# Patient Record
Sex: Male | Born: 1965 | Race: White | Hispanic: No | Marital: Married | Smoking: Never smoker
Health system: Southern US, Community
[De-identification: ages and names within clinical notes are randomized; demographics above are authoritative.]

## PROBLEM LIST (undated history)

## (undated) DIAGNOSIS — F419 Anxiety disorder, unspecified: Secondary | ICD-10-CM

## (undated) DIAGNOSIS — T4145XA Adverse effect of unspecified anesthetic, initial encounter: Secondary | ICD-10-CM

## (undated) DIAGNOSIS — IMO0002 Reserved for concepts with insufficient information to code with codable children: Secondary | ICD-10-CM

## (undated) DIAGNOSIS — T8859XA Other complications of anesthesia, initial encounter: Secondary | ICD-10-CM

## (undated) DIAGNOSIS — Z800 Family history of malignant neoplasm of digestive organs: Secondary | ICD-10-CM

## (undated) DIAGNOSIS — C189 Malignant neoplasm of colon, unspecified: Secondary | ICD-10-CM

## (undated) DIAGNOSIS — D649 Anemia, unspecified: Secondary | ICD-10-CM

## (undated) HISTORY — DX: Reserved for concepts with insufficient information to code with codable children: IMO0002

## (undated) HISTORY — DX: Family history of malignant neoplasm of digestive organs: Z80.0

## (undated) HISTORY — DX: Anxiety disorder, unspecified: F41.9

## (undated) HISTORY — PX: FRACTURE SURGERY: SHX138

## (undated) HISTORY — PX: TONSILLECTOMY: SUR1361

---

## 2008-01-22 ENCOUNTER — Encounter: Admission: RE | Admit: 2008-01-22 | Discharge: 2008-01-22 | Payer: Self-pay | Admitting: Neurosurgery

## 2010-02-18 ENCOUNTER — Encounter: Admission: RE | Admit: 2010-02-18 | Discharge: 2010-02-18 | Payer: Self-pay | Admitting: Neurosurgery

## 2010-09-23 ENCOUNTER — Other Ambulatory Visit: Payer: Self-pay | Admitting: Neurosurgery

## 2010-09-23 DIAGNOSIS — M47816 Spondylosis without myelopathy or radiculopathy, lumbar region: Secondary | ICD-10-CM

## 2010-09-25 ENCOUNTER — Ambulatory Visit
Admission: RE | Admit: 2010-09-25 | Discharge: 2010-09-25 | Disposition: A | Discharge status: Home / Self Care | Payer: BC Managed Care – PPO | Source: Ambulatory Visit | Attending: Neurosurgery | Admitting: Neurosurgery

## 2010-09-25 ENCOUNTER — Other Ambulatory Visit: Payer: Self-pay | Admitting: Neurosurgery

## 2010-09-25 DIAGNOSIS — M47816 Spondylosis without myelopathy or radiculopathy, lumbar region: Secondary | ICD-10-CM

## 2010-11-25 ENCOUNTER — Other Ambulatory Visit: Payer: Self-pay | Admitting: Neurosurgery

## 2010-11-25 DIAGNOSIS — M47816 Spondylosis without myelopathy or radiculopathy, lumbar region: Secondary | ICD-10-CM

## 2010-11-28 ENCOUNTER — Ambulatory Visit
Admission: RE | Admit: 2010-11-28 | Discharge: 2010-11-28 | Disposition: A | Discharge status: Home / Self Care | Payer: BC Managed Care – PPO | Source: Ambulatory Visit | Attending: Neurosurgery | Admitting: Neurosurgery

## 2010-11-28 DIAGNOSIS — M47816 Spondylosis without myelopathy or radiculopathy, lumbar region: Secondary | ICD-10-CM

## 2010-11-28 MED ORDER — METHYLPREDNISOLONE ACETATE 40 MG/ML INJ SUSP (RADIOLOG
120.0000 mg | Freq: Once | INTRAMUSCULAR | Status: AC
  Administered 2010-11-28: 120 mg via EPIDURAL

## 2010-11-28 MED ORDER — IOHEXOL 180 MG/ML  SOLN
1.0000 mL | Freq: Once | INTRAMUSCULAR | Status: AC | PRN
  Administered 2010-11-28: 1 mL via EPIDURAL

## 2011-07-27 ENCOUNTER — Other Ambulatory Visit: Payer: Self-pay | Admitting: Neurosurgery

## 2011-07-27 DIAGNOSIS — M549 Dorsalgia, unspecified: Secondary | ICD-10-CM

## 2011-07-30 ENCOUNTER — Ambulatory Visit
Admission: RE | Admit: 2011-07-30 | Discharge: 2011-07-30 | Disposition: A | Discharge status: Home / Self Care | Payer: BC Managed Care – PPO | Source: Ambulatory Visit | Attending: Neurosurgery | Admitting: Neurosurgery

## 2011-07-30 DIAGNOSIS — M549 Dorsalgia, unspecified: Secondary | ICD-10-CM

## 2011-07-30 MED ORDER — IOHEXOL 180 MG/ML  SOLN
1.0000 mL | Freq: Once | INTRAMUSCULAR | Status: AC | PRN
  Administered 2011-07-30: 1 mL via EPIDURAL

## 2011-07-30 MED ORDER — METHYLPREDNISOLONE ACETATE 40 MG/ML INJ SUSP (RADIOLOG
120.0000 mg | Freq: Once | INTRAMUSCULAR | Status: AC
  Administered 2011-07-30: 120 mg via EPIDURAL

## 2011-07-30 NOTE — Discharge Instructions (Signed)

## 2011-10-12 ENCOUNTER — Other Ambulatory Visit: Payer: Self-pay | Admitting: Neurosurgery

## 2011-10-12 DIAGNOSIS — M47816 Spondylosis without myelopathy or radiculopathy, lumbar region: Secondary | ICD-10-CM

## 2011-10-20 ENCOUNTER — Other Ambulatory Visit: Payer: BC Managed Care – PPO

## 2011-10-20 ENCOUNTER — Ambulatory Visit
Admission: RE | Admit: 2011-10-20 | Discharge: 2011-10-20 | Disposition: A | Discharge status: Home / Self Care | Payer: BC Managed Care – PPO | Source: Ambulatory Visit | Attending: Neurosurgery | Admitting: Neurosurgery

## 2011-10-20 DIAGNOSIS — M47816 Spondylosis without myelopathy or radiculopathy, lumbar region: Secondary | ICD-10-CM

## 2011-10-20 MED ORDER — METHYLPREDNISOLONE ACETATE 40 MG/ML INJ SUSP (RADIOLOG
120.0000 mg | Freq: Once | INTRAMUSCULAR | Status: AC
  Administered 2011-10-20: 120 mg via EPIDURAL

## 2011-10-20 MED ORDER — IOHEXOL 180 MG/ML  SOLN
1.0000 mL | Freq: Once | INTRAMUSCULAR | Status: AC | PRN
  Administered 2011-10-20: 1 mL via EPIDURAL

## 2011-11-09 HISTORY — PX: SPINE SURGERY: SHX786

## 2012-07-27 ENCOUNTER — Other Ambulatory Visit: Payer: Self-pay | Admitting: *Deleted

## 2012-07-27 MED ORDER — ALPRAZOLAM 0.5 MG PO TABS: 0.5000 mg | ORAL_TABLET | Freq: Three times a day (TID) | ORAL | Status: DC | PRN

## 2012-09-20 ENCOUNTER — Telehealth: Payer: Self-pay | Admitting: *Deleted

## 2012-09-22 ENCOUNTER — Other Ambulatory Visit: Payer: Self-pay

## 2012-09-22 NOTE — Telephone Encounter (Signed)
May refill this for 6 mo

## 2012-09-22 NOTE — Telephone Encounter (Signed)
In Epic as filled 4/1 5 refills and RX printed  That was when a bunch of RX's were printed and disappeared  Joyce Gross called the RX in with no refills  Patient bottle reflects that   Call RX in and let patient know

## 2012-09-23 ENCOUNTER — Telehealth: Payer: Self-pay | Admitting: *Deleted

## 2012-09-23 MED ORDER — ALPRAZOLAM 0.5 MG PO TABS: 0.5000 mg | ORAL_TABLET | Freq: Three times a day (TID) | ORAL | Status: DC | PRN

## 2012-09-23 NOTE — Telephone Encounter (Signed)
See refill request from 09/22/12 where [facility] approved it

## 2012-09-27 NOTE — Telephone Encounter (Signed)
Called and corrected RX for Xanax

## 2012-10-27 ENCOUNTER — Telehealth: Payer: Self-pay

## 2012-10-27 MED ORDER — TESTOSTERONE CYPIONATE 200 MG/ML IM SOLN: 200.0000 mg | INTRAMUSCULAR | Status: DC

## 2012-10-27 MED ORDER — ALPRAZOLAM 0.5 MG PO TABS: 0.5000 mg | ORAL_TABLET | Freq: Three times a day (TID) | ORAL | Status: DC | PRN

## 2012-10-27 NOTE — Telephone Encounter (Signed)
Needs xanax and testosterone refilled

## 2013-02-16 ENCOUNTER — Other Ambulatory Visit: Payer: Self-pay

## 2013-02-16 MED ORDER — TESTOSTERONE CYPIONATE 200 MG/ML IM SOLN: 200.0000 mg | INTRAMUSCULAR | Status: DC

## 2013-02-16 NOTE — Telephone Encounter (Signed)
Okay x1 Because of this is testosterone replacement patient needs visit with provider for testosterone level, PSA, and rectal exam. He also needs a CBC

## 2013-02-16 NOTE — Telephone Encounter (Signed)
Not seen since EPIC  [facility] 

## 2013-03-13 ENCOUNTER — Other Ambulatory Visit: Payer: Self-pay | Admitting: *Deleted

## 2013-03-13 NOTE — Telephone Encounter (Signed)
Rxs last filled on 10-6. Please advise. If approved please print and route to Pool A so nurse can call patient to pick up rxs

## 2013-03-13 NOTE — Telephone Encounter (Signed)
Unable to refill prescriptions because patient has not been seen in the past six-month

## 2013-03-16 ENCOUNTER — Ambulatory Visit (INDEPENDENT_AMBULATORY_CARE_PROVIDER_SITE_OTHER): Payer: BC Managed Care – PPO | Admitting: Family Medicine

## 2013-03-16 ENCOUNTER — Encounter: Payer: Self-pay | Admitting: Family Medicine

## 2013-03-16 VITALS — BP 138/90 | HR 67 | Temp 99.1°F | Ht 70.5 in | Wt 223.0 lb

## 2013-03-16 DIAGNOSIS — I10 Essential (primary) hypertension: Secondary | ICD-10-CM

## 2013-03-16 DIAGNOSIS — E785 Hyperlipidemia, unspecified: Secondary | ICD-10-CM

## 2013-03-16 DIAGNOSIS — Z800 Family history of malignant neoplasm of digestive organs: Secondary | ICD-10-CM | POA: Insufficient documentation

## 2013-03-16 DIAGNOSIS — E291 Testicular hypofunction: Secondary | ICD-10-CM

## 2013-03-16 DIAGNOSIS — E349 Endocrine disorder, unspecified: Secondary | ICD-10-CM | POA: Insufficient documentation

## 2013-03-16 DIAGNOSIS — F419 Anxiety disorder, unspecified: Secondary | ICD-10-CM | POA: Insufficient documentation

## 2013-03-16 DIAGNOSIS — E559 Vitamin D deficiency, unspecified: Secondary | ICD-10-CM

## 2013-03-16 DIAGNOSIS — F411 Generalized anxiety disorder: Secondary | ICD-10-CM

## 2013-03-16 DIAGNOSIS — Z8739 Personal history of other diseases of the musculoskeletal system and connective tissue: Secondary | ICD-10-CM

## 2013-03-16 DIAGNOSIS — N400 Enlarged prostate without lower urinary tract symptoms: Secondary | ICD-10-CM

## 2013-03-16 LAB — POCT UA - MICROSCOPIC ONLY
Crystals, Ur, HPF, POC: NEGATIVE
Yeast, UA: NEGATIVE

## 2013-03-16 LAB — POCT URINALYSIS DIPSTICK
Leukocytes, UA: NEGATIVE
Nitrite, UA: NEGATIVE
Protein, UA: NEGATIVE
Urobilinogen, UA: NEGATIVE
pH, UA: 6.5

## 2013-03-16 MED ORDER — ALPRAZOLAM 0.5 MG PO TABS: 0.5000 mg | ORAL_TABLET | Freq: Three times a day (TID) | ORAL | Status: DC | PRN

## 2013-03-16 NOTE — Progress Notes (Signed)
Subjective:    Patient ID: Luke Miller, male    DOB: 18-Sep-1965, 47 y.o.   MRN: [id number]  HPI Pt here for follow up and management of chronic medical problems. Patient is having no particular complaints except for his chronic back pain and his third shift work habits. He indicates that his energy level is good and that he continues to have problems going to sleep after working third shift. The need for these regular exams was explained to the patient today to monitor the prostate because of the testosterone injections and because of taking Xanax.      There are no active problems to display for this patient.  Outpatient Encounter Prescriptions as of 03/16/2013  Medication Sig  . ALPRAZolam (XANAX) 0.5 MG tablet Take 1 tablet (0.5 mg total) by mouth 3 (three) times daily as needed.  Marland Kitchen oxyCODONE (ROXICODONE) 15 MG immediate release tablet Take 1 tablet by mouth every 4 (four) hours as needed.  . testosterone cypionate (DEPO-TESTOSTERONE) 200 MG/ML injection Inject 1 mL (200 mg total) into the muscle every 14 (fourteen) days.    Review of Systems  Constitutional: Negative.   HENT: Negative.   Eyes: Negative.   Respiratory: Negative.   Cardiovascular: Negative.   Gastrointestinal: Negative.   Endocrine: Negative.   Genitourinary: Negative.   Musculoskeletal: Positive for back pain.  Skin: Negative.   Allergic/Immunologic: Negative.   Neurological: Negative.   Hematological: Negative.   Psychiatric/Behavioral: Negative.        Objective:   Physical Exam  Nursing note and vitals reviewed. Constitutional: He is oriented to person, place, and time. He appears well-developed and well-nourished. No distress.  HENT:  Head: Normocephalic and atraumatic.  Right Ear: External ear normal.  Left Ear: External ear normal.  Nose: Nose normal.  Mouth/Throat: Oropharynx is clear and moist. No oropharyngeal exudate.  Eyes: Conjunctivae and EOM are normal. Pupils are equal, round, and  reactive to light. Right eye exhibits no discharge. Left eye exhibits no discharge. No scleral icterus.  Neck: Normal range of motion. Neck supple. No tracheal deviation present. No thyromegaly present.  There were no carotid bruits  Cardiovascular: Normal rate, regular rhythm, normal heart sounds and intact distal pulses.  Exam reveals no gallop and no friction rub.   No murmur heard. At 72 per minute  Pulmonary/Chest: Effort normal and breath sounds normal. No respiratory distress. He has no wheezes. He has no rales. He exhibits no tenderness.  Abdominal: Soft. Bowel sounds are normal. He exhibits no mass. There is no tenderness. There is no rebound and no guarding.  Genitourinary: Rectum normal and penis normal.  The prostate was slightly enlarged, but there were no lumps or masses. There were no rectal masses. There was no inguinal hernia palpated. The right testicle rides high in the scrotum and the left testicle is small in size.  Musculoskeletal: Normal range of motion. He exhibits no edema and no tenderness.  Lymphadenopathy:    He has no cervical adenopathy.  Neurological: He is alert and oriented to person, place, and time. He has normal reflexes. No cranial nerve deficit.  Skin: Skin is warm and dry. No rash noted. No erythema. No pallor.  Psychiatric: He has a normal mood and affect. His behavior is normal. Judgment and thought content normal.   BP 138/90  Pulse 67  Temp(Src) 99.1 F (37.3 C) (Oral)  Ht 5' 10.5" (1.791 m)  Wt 223 lb (101.152 kg)  BMI 31.53 kg/m2  Assessment & Plan:   1. Testosterone deficiency   2. BPH (benign prostatic hyperplasia)   3. Vitamin D deficiency   4. HTN (hypertension)   5. Hyperlipidemia   6. Anxiety   7. Family history of colon cancer   8. History of chronic back pain    Orders Placed This Encounter  Procedures  . BMP8+EGFR  . Hepatic function panel  . Testosterone,Free and Total  . PSA, total and free  . NMR, lipoprofile   . Vit D  25 hydroxy (rtn osteoporosis monitoring)  . POCT CBC  . POCT UA - Microscopic Only  . POCT urinalysis dipstick   Meds ordered this encounter  Medications  . oxyCODONE (ROXICODONE) 15 MG immediate release tablet    Sig: Take 1 tablet by mouth every 4 (four) hours as needed.   Patient Instructions  Continue current medications. Try to take as few of the Xanax as possible. Continue good therapeutic lifestyle changes.  Fall precautions discussed with patient. Follow up as planned and earlier as needed.  Return to clinic in 6 months    Nyra Capes MD

## 2013-03-16 NOTE — Patient Instructions (Addendum)
Continue current medications. Try to take as few of the Xanax as possible. Continue good therapeutic lifestyle changes.  Fall precautions discussed with patient. Follow up as planned and earlier as needed.  Return to clinic in 6 months

## 2013-03-16 NOTE — Addendum Note (Signed)
Addended by: Magdalene River on: 03/16/2013 09:49 AM   Modules accepted: Orders

## 2013-03-16 NOTE — Addendum Note (Signed)
Addended by: Prescott Gum on: 03/16/2013 10:36 AM   Modules accepted: Orders

## 2013-03-21 ENCOUNTER — Encounter: Payer: Self-pay | Admitting: *Deleted

## 2013-03-21 NOTE — Progress Notes (Signed)
Quick Note:  Copy of labs sent to patient ______ 

## 2013-03-30 ENCOUNTER — Other Ambulatory Visit: Payer: Self-pay | Admitting: Family Medicine

## 2013-07-03 ENCOUNTER — Telehealth: Payer: Self-pay

## 2013-07-03 MED ORDER — PROMETHAZINE HCL 12.5 MG RE SUPP
12.5000 mg | Freq: Four times a day (QID) | RECTAL | Status: DC | PRN
Start: 2013-07-03 — End: 2013-07-29

## 2013-07-03 NOTE — Telephone Encounter (Signed)
Luke Miller has been throwing up and has a fever   Can you call in some Phenergin to [facility] in [place]

## 2013-07-04 NOTE — Telephone Encounter (Signed)
x

## 2013-07-18 ENCOUNTER — Encounter (HOSPITAL_COMMUNITY): Payer: Self-pay | Admitting: *Deleted

## 2013-07-18 ENCOUNTER — Ambulatory Visit (INDEPENDENT_AMBULATORY_CARE_PROVIDER_SITE_OTHER): Payer: BC Managed Care – PPO

## 2013-07-18 ENCOUNTER — Encounter: Payer: Self-pay | Admitting: Family Medicine

## 2013-07-18 ENCOUNTER — Ambulatory Visit (HOSPITAL_COMMUNITY)
Admission: RE | Admit: 2013-07-18 | Discharge: 2013-07-18 | Disposition: A | Discharge status: Home / Self Care | Payer: BC Managed Care – PPO | Source: Ambulatory Visit | Attending: Family Medicine | Admitting: Family Medicine

## 2013-07-18 ENCOUNTER — Other Ambulatory Visit: Payer: Self-pay

## 2013-07-18 ENCOUNTER — Other Ambulatory Visit: Payer: Self-pay | Admitting: Family Medicine

## 2013-07-18 ENCOUNTER — Ambulatory Visit (INDEPENDENT_AMBULATORY_CARE_PROVIDER_SITE_OTHER): Payer: BC Managed Care – PPO | Admitting: Family Medicine

## 2013-07-18 ENCOUNTER — Inpatient Hospital Stay (HOSPITAL_COMMUNITY)
Admission: AD | Admit: 2013-07-18 | Discharge: 2013-07-29 | DRG: 329 | Disposition: A | Discharge status: Home / Self Care | Payer: BC Managed Care – PPO | Source: Ambulatory Visit | Attending: Internal Medicine | Admitting: Internal Medicine

## 2013-07-18 VITALS — BP 155/91 | HR 88 | Temp 100.3°F | Ht 70.5 in | Wt 222.0 lb

## 2013-07-18 DIAGNOSIS — Z800 Family history of malignant neoplasm of digestive organs: Secondary | ICD-10-CM

## 2013-07-18 DIAGNOSIS — G9340 Encephalopathy, unspecified: Secondary | ICD-10-CM | POA: Diagnosis not present

## 2013-07-18 DIAGNOSIS — E291 Testicular hypofunction: Secondary | ICD-10-CM

## 2013-07-18 DIAGNOSIS — R509 Fever, unspecified: Secondary | ICD-10-CM

## 2013-07-18 DIAGNOSIS — R634 Abnormal weight loss: Secondary | ICD-10-CM

## 2013-07-18 DIAGNOSIS — C189 Malignant neoplasm of colon, unspecified: Secondary | ICD-10-CM | POA: Diagnosis present

## 2013-07-18 DIAGNOSIS — K5289 Other specified noninfective gastroenteritis and colitis: Secondary | ICD-10-CM

## 2013-07-18 DIAGNOSIS — R7989 Other specified abnormal findings of blood chemistry: Secondary | ICD-10-CM

## 2013-07-18 DIAGNOSIS — F419 Anxiety disorder, unspecified: Secondary | ICD-10-CM | POA: Diagnosis present

## 2013-07-18 DIAGNOSIS — M62838 Other muscle spasm: Secondary | ICD-10-CM | POA: Diagnosis present

## 2013-07-18 DIAGNOSIS — K5730 Diverticulosis of large intestine without perforation or abscess without bleeding: Secondary | ICD-10-CM | POA: Diagnosis present

## 2013-07-18 DIAGNOSIS — E349 Endocrine disorder, unspecified: Secondary | ICD-10-CM | POA: Diagnosis present

## 2013-07-18 DIAGNOSIS — Z801 Family history of malignant neoplasm of trachea, bronchus and lung: Secondary | ICD-10-CM

## 2013-07-18 DIAGNOSIS — R5383 Other fatigue: Secondary | ICD-10-CM

## 2013-07-18 DIAGNOSIS — D126 Benign neoplasm of colon, unspecified: Secondary | ICD-10-CM | POA: Diagnosis present

## 2013-07-18 DIAGNOSIS — D509 Iron deficiency anemia, unspecified: Secondary | ICD-10-CM | POA: Diagnosis present

## 2013-07-18 DIAGNOSIS — R946 Abnormal results of thyroid function studies: Secondary | ICD-10-CM | POA: Diagnosis present

## 2013-07-18 DIAGNOSIS — C185 Malignant neoplasm of splenic flexure: Secondary | ICD-10-CM | POA: Diagnosis present

## 2013-07-18 DIAGNOSIS — R5381 Other malaise: Secondary | ICD-10-CM

## 2013-07-18 DIAGNOSIS — C186 Malignant neoplasm of descending colon: Principal | ICD-10-CM | POA: Diagnosis present

## 2013-07-18 DIAGNOSIS — G8929 Other chronic pain: Secondary | ICD-10-CM

## 2013-07-18 DIAGNOSIS — D72829 Elevated white blood cell count, unspecified: Secondary | ICD-10-CM | POA: Diagnosis not present

## 2013-07-18 DIAGNOSIS — R112 Nausea with vomiting, unspecified: Secondary | ICD-10-CM

## 2013-07-18 DIAGNOSIS — K559 Vascular disorder of intestine, unspecified: Secondary | ICD-10-CM | POA: Diagnosis present

## 2013-07-18 DIAGNOSIS — K529 Noninfective gastroenteritis and colitis, unspecified: Secondary | ICD-10-CM | POA: Diagnosis present

## 2013-07-18 DIAGNOSIS — R1013 Epigastric pain: Secondary | ICD-10-CM

## 2013-07-18 DIAGNOSIS — K56609 Unspecified intestinal obstruction, unspecified as to partial versus complete obstruction: Secondary | ICD-10-CM | POA: Diagnosis present

## 2013-07-18 DIAGNOSIS — F411 Generalized anxiety disorder: Secondary | ICD-10-CM | POA: Diagnosis present

## 2013-07-18 DIAGNOSIS — K5641 Fecal impaction: Secondary | ICD-10-CM | POA: Diagnosis present

## 2013-07-18 DIAGNOSIS — R252 Cramp and spasm: Secondary | ICD-10-CM

## 2013-07-18 HISTORY — DX: Malignant neoplasm of colon, unspecified: C18.9

## 2013-07-18 LAB — COMPREHENSIVE METABOLIC PANEL
ALBUMIN: 3.3 g/dL — AB (ref 3.5–5.2)
ALK PHOS: 76 U/L (ref 39–117)
ALT: 7 U/L (ref 0–53)
AST: 10 U/L (ref 0–37)
BILIRUBIN TOTAL: 0.3 mg/dL (ref 0.3–1.2)
BUN: 10 mg/dL (ref 6–23)
CHLORIDE: 99 meq/L (ref 96–112)
CO2: 27 mEq/L (ref 19–32)
Calcium: 9.6 mg/dL (ref 8.4–10.5)
Creatinine, Ser: 0.89 mg/dL (ref 0.50–1.35)
GFR calc Af Amer: >90 mL/min (ref >90)
GFR calc non Af Amer: >90 mL/min (ref >90)
Glucose, Bld: 142 mg/dL — ABNORMAL HIGH (ref 70–99)
POTASSIUM: 4.5 meq/L (ref 3.7–5.3)
Sodium: 138 mEq/L (ref 137–147)
Total Protein: 7.7 g/dL (ref 6.0–8.3)

## 2013-07-18 LAB — CBC WITH DIFFERENTIAL/PLATELET
BASOS PCT: 0 % (ref 0–1)
Basophils Absolute: 0 10*3/uL (ref 0.0–0.1)
EOS ABS: 0 10*3/uL (ref 0.0–0.7)
Eosinophils Relative: 0 % (ref 0–5)
HEMATOCRIT: 40.4 % (ref 39.0–52.0)
HEMOGLOBIN: 13 g/dL (ref 13.0–17.0)
Lymphocytes Relative: 7 % — ABNORMAL LOW (ref 12–46)
Lymphs Abs: 0.6 10*3/uL — ABNORMAL LOW (ref 0.7–4.0)
MCH: 24.2 pg — AB (ref 26.0–34.0)
MCHC: 32.2 g/dL (ref 30.0–36.0)
MCV: 75.1 fL — AB (ref 78.0–100.0)
MONO ABS: 0.2 10*3/uL (ref 0.1–1.0)
Monocytes Relative: 2 % — ABNORMAL LOW (ref 3–12)
NEUTROS ABS: 7.9 10*3/uL — AB (ref 1.7–7.7)
Neutrophils Relative %: 91 % — ABNORMAL HIGH (ref 43–77)
Platelets: 351 10*3/uL (ref 150–400)
RBC: 5.38 MIL/uL (ref 4.22–5.81)
RDW: 13.6 % (ref 11.5–15.5)
WBC: 8.7 10*3/uL (ref 4.0–10.5)

## 2013-07-18 LAB — POCT CBC
Granulocyte percent: 86 %G — AB (ref 37–80)
HCT, POC: 42.2 % — AB (ref 43.5–53.7)
Hemoglobin: 13.2 g/dL — AB (ref 14.1–18.1)
Lymph, poc: 1 (ref 0.6–3.4)
MCH, POC: 23.7 pg — AB (ref 27–31.2)
MCHC: 31.3 g/dL — AB (ref 31.8–35.4)
MCV: 75.7 fL — AB (ref 80–97)
MPV: 8.1 fL (ref 0–99.8)
POC GRANULOCYTE: 7.9 — AB (ref 2–6.9)
POC LYMPH PERCENT: 11 %L (ref 10–50)
Platelet Count, POC: 354 10*3/uL (ref 142–424)
RBC: 5.6 M/uL (ref 4.69–6.13)
RDW, POC: 14.7 %
WBC: 9.2 10*3/uL (ref 4.6–10.2)

## 2013-07-18 LAB — POCT INFLUENZA A/B
Influenza A, POC: NEGATIVE
Influenza B, POC: NEGATIVE

## 2013-07-18 LAB — APTT: aPTT: 39 seconds — ABNORMAL HIGH (ref 24–37)

## 2013-07-18 LAB — PROTIME-INR
INR: 1.09 (ref 0.00–1.49)
Prothrombin Time: 13.9 s (ref 11.6–15.2)

## 2013-07-18 LAB — MAGNESIUM: Magnesium: 2.2 mg/dL (ref 1.5–2.5)

## 2013-07-18 LAB — PHOSPHORUS: Phosphorus: 2.7 mg/dL (ref 2.3–4.6)

## 2013-07-18 MED ORDER — ALPRAZOLAM 1 MG PO TABS
1.5000 mg | ORAL_TABLET | Freq: Every day | ORAL | Status: DC
  Administered 2013-07-18 – 2013-07-23 (×6): 1.5 mg via ORAL
  Filled 2013-07-18 (×12): qty 1

## 2013-07-18 MED ORDER — ACETAMINOPHEN 650 MG RE SUPP: 650.0000 mg | Freq: Four times a day (QID) | RECTAL | Status: DC | PRN

## 2013-07-18 MED ORDER — NICOTINE 21 MG/24HR TD PT24
21.0000 mg | MEDICATED_PATCH | Freq: Every day | TRANSDERMAL | Status: DC
  Administered 2013-07-18 – 2013-07-20 (×3): 21 mg via TRANSDERMAL
  Filled 2013-07-18 (×7): qty 1

## 2013-07-18 MED ORDER — DOCUSATE SODIUM 100 MG PO CAPS
100.0000 mg | ORAL_CAPSULE | Freq: Two times a day (BID) | ORAL | Status: DC
  Administered 2013-07-18 – 2013-07-19 (×3): 100 mg via ORAL
  Filled 2013-07-18 (×5): qty 1

## 2013-07-18 MED ORDER — TESTOSTERONE CYPIONATE 200 MG/ML IM SOLN: 200.0000 mg | INTRAMUSCULAR | Status: DC

## 2013-07-18 MED ORDER — PROMETHAZINE HCL 25 MG PO TABS: 25.0000 mg | ORAL_TABLET | Freq: Three times a day (TID) | ORAL | Status: DC | PRN

## 2013-07-18 MED ORDER — OXYCODONE HCL 5 MG PO TABS
15.0000 mg | ORAL_TABLET | ORAL | Status: DC | PRN
  Administered 2013-07-18: 15 mg via ORAL
  Filled 2013-07-18: qty 3

## 2013-07-18 MED ORDER — ZOLPIDEM TARTRATE 5 MG PO TABS
5.0000 mg | ORAL_TABLET | Freq: Once | ORAL | Status: AC
  Administered 2013-07-18: 5 mg via ORAL
  Filled 2013-07-18: qty 1

## 2013-07-18 MED ORDER — SODIUM CHLORIDE 0.9 % IV SOLN
INTRAVENOUS | Status: AC
  Administered 2013-07-18: 19:00:00 via INTRAVENOUS

## 2013-07-18 MED ORDER — ONDANSETRON HCL 4 MG PO TABS: 4.0000 mg | ORAL_TABLET | Freq: Four times a day (QID) | ORAL | Status: DC | PRN

## 2013-07-18 MED ORDER — HYDRALAZINE HCL 20 MG/ML IJ SOLN
10.0000 mg | Freq: Four times a day (QID) | INTRAMUSCULAR | Status: DC | PRN
Start: 2013-07-18 — End: 2013-07-24
  Filled 2013-07-18: qty 0.5

## 2013-07-18 MED ORDER — PROMETHAZINE HCL 25 MG RE SUPP: 12.5000 mg | Freq: Four times a day (QID) | RECTAL | Status: DC | PRN

## 2013-07-18 MED ORDER — CIPROFLOXACIN IN D5W 400 MG/200ML IV SOLN
400.0000 mg | Freq: Two times a day (BID) | INTRAVENOUS | Status: DC
  Administered 2013-07-18 – 2013-07-21 (×6): 400 mg via INTRAVENOUS
  Filled 2013-07-18 (×6): qty 200

## 2013-07-18 MED ORDER — ACETAMINOPHEN 325 MG PO TABS: 650.0000 mg | ORAL_TABLET | Freq: Four times a day (QID) | ORAL | Status: DC | PRN

## 2013-07-18 MED ORDER — METRONIDAZOLE IN NACL 5-0.79 MG/ML-% IV SOLN
500.0000 mg | Freq: Three times a day (TID) | INTRAVENOUS | Status: DC
  Administered 2013-07-18 – 2013-07-21 (×9): 500 mg via INTRAVENOUS
  Filled 2013-07-18 (×9): qty 100

## 2013-07-18 MED ORDER — MORPHINE SULFATE 2 MG/ML IJ SOLN
1.0000 mg | INTRAMUSCULAR | Status: DC | PRN
  Administered 2013-07-18 – 2013-07-24 (×22): 1 mg via INTRAVENOUS
  Filled 2013-07-18 (×23): qty 1

## 2013-07-18 MED ORDER — PROMETHAZINE HCL 25 MG/ML IJ SOLN
25.0000 mg | Freq: Once | INTRAMUSCULAR | Status: AC
  Administered 2013-07-18: 25 mg via INTRAMUSCULAR

## 2013-07-18 MED ORDER — ONDANSETRON HCL 4 MG/2ML IJ SOLN
4.0000 mg | Freq: Four times a day (QID) | INTRAMUSCULAR | Status: DC | PRN
  Administered 2013-07-18 – 2013-07-24 (×5): 4 mg via INTRAVENOUS
  Filled 2013-07-18 (×5): qty 2

## 2013-07-18 NOTE — Patient Instructions (Signed)
Continue clear liquids, small amounts at a time Avoid caffeine milk cheese ice cream and dairy products We will arrange to get the CT scan of your abdomen and pelvis today  Only do sips of clear liquids. We will call you when additional lab work results are available

## 2013-07-18 NOTE — Progress Notes (Signed)
Subjective:    Patient ID: Luke Miller, male    DOB: 1965/12/17, 47 y.o.   MRN: [id number]  HPI Patient here today for fatigue, fever, upset GI. The patient is at the visit today with his wife. He complains of abdominal pain and nausea episodes with normal bowel movements for the past 3 days. According to his wife he has had episodes of this for several months. The pain is located primarily in the epigastric area. He denies melena or blood in the stool. He also denies diarrhea. A KUB today was negative for any apparent kidney stones.      Patient Active Problem List   Diagnosis Date Noted  . Testosterone deficiency 03/16/2013  . Gen. anxiety disorder 03/16/2013  . Family history of colon cancer 03/16/2013  . History of chronic back pain 03/16/2013  . BPH (benign prostatic hyperplasia) 03/16/2013  . Vitamin D deficiency 03/16/2013   Outpatient Encounter Prescriptions as of 07/18/2013  Medication Sig  . ALPRAZolam (XANAX) 0.5 MG tablet Take 1 tablet (0.5 mg total) by mouth 3 (three) times daily as needed.  Marland Kitchen oxyCODONE (ROXICODONE) 15 MG immediate release tablet Take 1 tablet by mouth every 4 (four) hours as needed.  . promethazine (PHENERGAN) 12.5 MG suppository Place 1 suppository (12.5 mg total) rectally every 6 (six) hours as needed for nausea or vomiting.  Marland Kitchen testosterone cypionate (DEPO-TESTOSTERONE) 200 MG/ML injection Inject 1 mL (200 mg total) into the muscle every 14 (fourteen) days.  . [DISCONTINUED] Vitamin D, Ergocalciferol, (DRISDOL) 50000 UNITS CAPS capsule TAKE 1 CAPSULE WEEKLY.    Review of Systems  Constitutional: Positive for fever, chills, fatigue and unexpected weight change (some wt loss).  HENT: Negative.  Negative for congestion.   Eyes: Negative.   Respiratory: Negative.  Negative for cough.   Cardiovascular: Negative.   Gastrointestinal: Positive for nausea and vomiting.       Family hx of colon cancer  Endocrine: Negative.   Genitourinary: Negative.     Musculoskeletal: Negative.   Skin: Negative.   Allergic/Immunologic: Negative.   Neurological: Positive for weakness. Negative for dizziness, light-headedness and headaches.  Hematological: Negative.   Psychiatric/Behavioral: Negative.        Objective:   Physical Exam  Nursing note and vitals reviewed. Constitutional: He is oriented to person, place, and time. He appears well-developed and well-nourished. He appears distressed.  HENT:  Head: Normocephalic and atraumatic.  Right Ear: External ear normal.  Left Ear: External ear normal.  Nose: Nose normal.  Mouth/Throat: Oropharynx is clear and moist. No oropharyngeal exudate.  Eyes: Conjunctivae and EOM are normal. Pupils are equal, round, and reactive to light. Right eye exhibits no discharge. Left eye exhibits no discharge. No scleral icterus.  Neck: Normal range of motion. Neck supple. No tracheal deviation present. No thyromegaly present.  Cardiovascular: Normal rate, regular rhythm, normal heart sounds and intact distal pulses.  Exam reveals no gallop and no friction rub.   No murmur heard. Pulmonary/Chest: Effort normal and breath sounds normal. No respiratory distress. He has no wheezes. He has no rales. He exhibits no tenderness.  Abdominal: Soft. Bowel sounds are normal. He exhibits no mass. There is tenderness. There is no rebound and no guarding.  Liver palpable right upper quadrant, tenderness medial to the epigastrium  Musculoskeletal: Normal range of motion.  Lymphadenopathy:    He has no cervical adenopathy.  Neurological: He is alert and oriented to person, place, and time.  Skin: Skin is warm and dry. No rash noted.  Psychiatric: He has a normal mood and affect. His behavior is normal. Judgment and thought content normal.  Nauseated and uncomfortable during the present exam   BP 155/91  Pulse 88  Temp(Src) 100.3 F (37.9 C) (Oral)  Ht 5' 10.5" (1.791 m)  Wt 222 lb (100.699 kg)  BMI 31.39 kg/m2  [facility]  reading (PRIMARY) by  [facility] for any stone                                  Results for orders placed in visit on 07/18/13  POCT CBC      Result Value Ref Range   WBC 9.2  4.6 - 10.2 K/uL   Lymph, poc 1.0  0.6 - 3.4   POC LYMPH PERCENT 11.0  10 - 50 %L   POC Granulocyte 7.9 (*) 2 - 6.9   Granulocyte percent 86.0 (*) 37 - 80 %G   RBC 5.6  4.69 - 6.13 M/uL   Hemoglobin 13.2 (*) 14.1 - 18.1 g/dL   HCT, POC 42.2 (*) 43.5 - 53.7 %   MCV 75.7 (*) 80 - 97 fL   MCH, POC 23.7 (*) 27 - 31.2 pg   MCHC 31.3 (*) 31.8 - 35.4 g/dL   RDW, POC 14.7     Platelet Count, POC 354.0  142 - 424 K/uL   MPV 8.1  0 - 99.8 fL  POCT INFLUENZA A/B      Result Value Ref Range   Influenza A, POC Negative     Influenza B, POC Negative           Assessment & Plan:  1. Other malaise and fatigue - DG Chest 2 View; Future - CT Abdomen Pelvis W Wo Contrast; Future - POCT Influenza A/B  2. Nausea with vomiting - DG Chest 2 View; Future - POCT CBC - promethazine (PHENERGAN) injection 25 mg; Inject 1 mL (25 mg total) into the muscle once. - CT Abdomen Pelvis W Wo Contrast; Future - POCT Influenza A/B  3. Fever, unspecified - DG Chest 2 View; Future - POCT CBC - POCT Influenza A/B  4. Muscle cramping - DG Chest 2 View; Future - CT Abdomen Pelvis W Wo Contrast; Future - POCT Influenza A/B  5. Loss of weight - CT Abdomen Pelvis W Wo Contrast; Future - POCT Influenza A/B  6. Abdominal pain, chronic, epigastric - POCT Influenza A/B  Patient Instructions  Continue clear liquids, small amounts at a time Avoid caffeine milk cheese ice cream and dairy products We will arrange to get the CT scan of your abdomen and pelvis today  Only do sips of clear liquids. We will call you when additional lab work results are available    Arrie Senate MD

## 2013-07-18 NOTE — H&P (Addendum)
[facility] History and Physical  Luke Miller CZY:[id number] DOB: 09/10/1965 DOA: 07/18/2013  Referring physician: ER physician PCP: [name], MD   Chief Complaint: abdominal pain, vomiting and fever  HPI:  48 year old male with past medical history of testosterone deficiency, anxiety who presented from Dr. Tawanna Sat office with complaints of worsening abdominal pain, nausea and non bloody vomiting. Abdominal pain and nausea has been ongoing for 6 weeks. Pt reported his grandchildren were sick with stomach flu and he has gotten it too and seemed that he never got over it. He described abdominal pain as cramping with intermittent spasms which ger spontaneously resolved  Abdominal pain got worse Thursday ( 5 days ago). He only had nausea today prior to this admission. He also had subjective fevers, malaise and poor oral intake, anorexia. His last bowel movement was few days ago. He has never experienced diarrhea. No other complaints of chest pain, shortness of breath or palpitations. No lightheadedness or loss of consciousness. This was a direct admission so admission blood work is not available and only blood work we have is from the office which was essentially unremarkable. CT abd showed possible acute infectious colitis or colonic malignancy. Abd xray and chest x ray did not reveal acute findings.   Assessment and Plan:  Principal Problem: Abdominal pain, nausea, vomiting - possible colitis versus colonic malignancy as mentioned on CT abdomen. I spoke with Dr. Amedeo Plenty of GI and they will see the pt in am for further recomendation - Pt is now on clear liquid diet but NPO post midnight for possible colonoscopy in am. - started cipro and flagyl - pain management with morphine 1 mg every 3 hours PRN severe pain - IV fluids for next 12 hours   Radiological Exams on Admission: Ct Abdomen Pelvis Wo Contrast 07/18/2013    IMPRESSION Abnormal splenic flexure of the colon in the left upper  quadrant with wall thickening, surrounding pericolonic strandy edema and prominent adjacent nonenlarged lymph nodes compatible with either acute colitis versus a colonic malignancy.  Proximal colon is distended with retained formed stool and air, compatible with some degree of partial colonic obstruction.  No free air or abscess   Dg Chest 2 View 07/18/2013  IMPRESSION No active disease.    Dg Abd 1 View 07/18/2013    IMPRESSION: Nonobstructive bowel gas pattern. The upper abdomen is not completely included in the images.     Code Status: Full Family Communication: wife at bedside Disposition Plan: Admit for further evaluation  Leisa Lenz, MD  [facility] Pager [phone]  Review of Systems:  Constitutional: positive for fever, chills and malaise/fatigue. Negative for diaphoresis.  HENT: Negative for hearing loss, ear pain, nosebleeds, congestion, sore throat, neck pain, tinnitus and ear discharge.   Eyes: Negative for blurred vision, double vision, photophobia, pain, discharge and redness.  Respiratory: Negative for cough, hemoptysis, sputum production, shortness of breath, wheezing and stridor.   Cardiovascular: Negative for chest pain, palpitations, orthopnea, claudication and leg swelling.  Gastrointestinal: per HPI Genitourinary: Negative for dysuria, urgency, frequency, hematuria and flank pain.  Musculoskeletal: Negative for myalgias, back pain, joint pain and falls.  Skin: Negative for itching and rash.  Neurological: Negative for dizziness and weakness. Negative for tingling, tremors, sensory change, speech change, focal weakness, loss of consciousness and headaches.  Endo/Heme/Allergies: Negative for environmental allergies and polydipsia. Does not bruise/bleed easily.  Psychiatric/Behavioral: Negative for suicidal ideas. The patient is not nervous/anxious.      Past Medical History  Diagnosis  Date  . Compression fracture     vertebrae  . Anxiety    Past Surgical  History  Procedure Laterality Date  . Spine surgery  7/13    T-12   Social History:  reports that he has never smoked. He does not have any smokeless tobacco history on file. He reports that he does not drink alcohol or use illicit drugs.  No Known Allergies  Family History  Problem Relation Age of Onset  . Cancer Maternal Grandmother     lung  . Cancer Maternal Grandfather     colon     Prior to Admission medications   Medication Sig Start Date End Date Taking? Authorizing Provider  ALPRAZolam Duanne Moron) 0.5 MG tablet Take 1.5 mg by mouth at bedtime.   Yes Historical Provider, MD  oxyCODONE (ROXICODONE) 15 MG immediate release tablet Take 15 mg by mouth every 4 (four) hours as needed for pain.  03/09/13  Yes Historical Provider, MD  promethazine (PHENERGAN) 12.5 MG suppository Place 1 suppository (12.5 mg total) rectally every 6 (six) hours as needed for nausea or vomiting. 07/03/13  Yes Mary-Margaret Hassell Done, FNP  testosterone cypionate (DEPO-TESTOSTERONE) 200 MG/ML injection Inject 1 mL (200 mg total) into the muscle every 14 (fourteen) days. 02/16/13  Yes Chipper Herb, MD   Physical Exam: Filed Vitals:   07/18/13 1748  BP: 152/87  Pulse: 71  Temp: 99.7 F (37.6 C)  TempSrc: Oral  Resp: 18  Height: 5\' 11"  (1.803 m)  Weight: 96.3 kg (212 lb 4.9 oz)  SpO2: 100%    Physical Exam  Constitutional: Appears well-developed and well-nourished. No distress.  HENT: Normocephalic. External right and left ear normal. Oropharynx is clear and moist.  Eyes: Conjunctivae and EOM are normal. PERRLA, no scleral icterus.  Neck: Normal ROM. Neck supple. No JVD. No tracheal deviation. No thyromegaly.  CVS: RRR, S1/S2 +, no murmurs, no gallops, no carotid bruit.  Pulmonary: Effort and breath sounds normal, no stridor, rhonchi, wheezes, rales.  Abdominal: Soft. BS +,  no distension, tenderness in mid abdomen, no rebound or guarding.  Musculoskeletal: Normal range of motion. No edema and no  tenderness.  Lymphadenopathy: No lymphadenopathy noted, cervical, inguinal. Neuro: Alert. Normal reflexes, muscle tone coordination. No cranial nerve deficit. Skin: Skin is warm and dry. No rash noted. Not diaphoretic. No erythema. No pallor.  Psychiatric: Normal mood and affect. Behavior, judgment, thought content normal.   Labs on Admission:  Basic Metabolic Panel: No results found for this basename: NA, K, CL, CO2, GLUCOSE, BUN, CREATININE, CALCIUM, MG, PHOS,  in the last 168 hours Liver Function Tests: No results found for this basename: AST, ALT, ALKPHOS, BILITOT, PROT, ALBUMIN,  in the last 168 hours No results found for this basename: LIPASE, AMYLASE,  in the last 168 hours No results found for this basename: AMMONIA,  in the last 168 hours CBC:  Recent Labs Lab 07/18/13 1232  WBC 9.2  HGB 13.2*  HCT 42.2*  MCV 75.7*   Cardiac Enzymes: No results found for this basename: CKTOTAL, CKMB, CKMBINDEX, TROPONINI,  in the last 168 hours BNP: No components found with this basename: POCBNP,  CBG: No results found for this basename: GLUCAP,  in the last 168 hours  If 7PM-7AM, please contact night-coverage [email or web address] Password [facility] 07/18/2013, 5:56 PM

## 2013-07-19 DIAGNOSIS — Z800 Family history of malignant neoplasm of digestive organs: Secondary | ICD-10-CM

## 2013-07-19 DIAGNOSIS — R7989 Other specified abnormal findings of blood chemistry: Secondary | ICD-10-CM | POA: Diagnosis present

## 2013-07-19 DIAGNOSIS — R946 Abnormal results of thyroid function studies: Secondary | ICD-10-CM

## 2013-07-19 LAB — CBC
HCT: 37.9 % — ABNORMAL LOW (ref 39.0–52.0)
Hemoglobin: 12.2 g/dL — ABNORMAL LOW (ref 13.0–17.0)
MCH: 24.4 pg — AB (ref 26.0–34.0)
MCHC: 32.2 g/dL (ref 30.0–36.0)
MCV: 75.6 fL — AB (ref 78.0–100.0)
Platelets: 327 10*3/uL (ref 150–400)
RBC: 5.01 MIL/uL (ref 4.22–5.81)
RDW: 13.6 % (ref 11.5–15.5)
WBC: 7.3 10*3/uL (ref 4.0–10.5)

## 2013-07-19 LAB — COMPREHENSIVE METABOLIC PANEL
ALBUMIN: 2.9 g/dL — AB (ref 3.5–5.2)
ALT: 6 U/L (ref 0–53)
AST: 8 U/L (ref 0–37)
Alkaline Phosphatase: 66 U/L (ref 39–117)
BILIRUBIN TOTAL: 0.3 mg/dL (ref 0.3–1.2)
BUN: 10 mg/dL (ref 6–23)
CO2: 24 mEq/L (ref 19–32)
Calcium: 8.8 mg/dL (ref 8.4–10.5)
Chloride: 101 mEq/L (ref 96–112)
Creatinine, Ser: 0.83 mg/dL (ref 0.50–1.35)
GFR calc Af Amer: >90 mL/min (ref >90)
GFR calc non Af Amer: >90 mL/min (ref >90)
Glucose, Bld: 111 mg/dL — ABNORMAL HIGH (ref 70–99)
Potassium: 3.7 mEq/L (ref 3.7–5.3)
Sodium: 139 mEq/L (ref 137–147)
Total Protein: 6.8 g/dL (ref 6.0–8.3)

## 2013-07-19 LAB — GLUCOSE, CAPILLARY: Glucose-Capillary: 115 mg/dL — ABNORMAL HIGH (ref 70–99)

## 2013-07-19 LAB — TSH: TSH: 0.267 u[IU]/mL — ABNORMAL LOW (ref 0.350–4.500)

## 2013-07-19 LAB — T4, FREE: Free T4: 1.23 ng/dL (ref 0.80–1.80)

## 2013-07-19 MED ORDER — BISACODYL 5 MG PO TBEC
10.0000 mg | DELAYED_RELEASE_TABLET | Freq: Once | ORAL | Status: AC
  Administered 2013-07-19: 10 mg via ORAL
  Filled 2013-07-19: qty 2

## 2013-07-19 MED ORDER — BOOST / RESOURCE BREEZE PO LIQD
1.0000 | Freq: Two times a day (BID) | ORAL | Status: DC
  Administered 2013-07-22 – 2013-07-23 (×4): 1 via ORAL

## 2013-07-19 MED ORDER — PEG 3350-KCL-NA BICARB-NACL 420 G PO SOLR
2000.0000 mL | Freq: Once | ORAL | Status: DC
  Filled 2013-07-19: qty 4000

## 2013-07-19 MED ORDER — PEG 3350-KCL-NA BICARB-NACL 420 G PO SOLR
4000.0000 mL | Freq: Once | ORAL | Status: AC
  Administered 2013-07-19: 4000 mL via ORAL
  Filled 2013-07-19: qty 4000

## 2013-07-19 MED ORDER — ZOLPIDEM TARTRATE 5 MG PO TABS
5.0000 mg | ORAL_TABLET | Freq: Once | ORAL | Status: AC
  Administered 2013-07-19: 5 mg via ORAL
  Filled 2013-07-19: qty 1

## 2013-07-19 NOTE — Progress Notes (Signed)
INITIAL NUTRITION ASSESSMENT  DOCUMENTATION CODES Per approved criteria  -Not Applicable   INTERVENTION:  Once PO diet advances, recommend PO supplement such as peach Resource Breeze BID, each supplement provides 250 kcal and 9 grams protein  RD to follow nutrition care plan  NUTRITION DIAGNOSIS: Inadequate oral intake related to abdominal pain, nausea, and vomiting as evidenced by patient report of PO intake and weight loss.   Goal: Patient to meet >/= 90% of estimated nutrition needs  Monitor:  PO intake and diet advancement, I/Os, weight trends, labs  Reason for Assessment: Malnutrition Screening Tool Risk  48 y.o. male  Admitting Dx: Colitis  ASSESSMENT: Patient with past medical history of testosterone deficiency, anxiety who presented from Dr. Tawanna Sat office with complaints of worsening abdominal pain, nausea and non bloody vomiting. Abdominal pain and nausea has been ongoing for 6 weeks. Pt reported his grandchildren were sick with stomach flu and he has gotten it too and seemed that he never got over it. He described abdominal pain as cramping with intermittent spasms which ger spontaneously resolved Abdominal pain got worse Thursday ( 5 days ago). He only had nausea today prior to this admission. He also had subjective fevers, malaise and poor oral intake, anorexia. His last bowel movement was few days ago. He has never experienced diarrhea.  CT abd showed possible acute infectious colitis or colonic malignancy.  Patient's wife stated that patient has had reoccurring cycles of nausea and abdominal pain since January. Over the weekend patient had worsening symptoms with nausea and vomiting. Patient reported not being able to keep anything down including water.   When patient feels well, patient reports eating 1 meal per day due to work schedule. Patient usually eats a variety of items from [employer]. Patient will also sometimes snack at home on items like oatmeal,  nabs, and sandwiches. Patient wife stated that they have been drinking more regular sodas over the past 6 months. Patient's wife also mentioned that patient drinks coffee based energy drinks daily.   Patient weighted around 280-300 lb 5 years ago, but lost down to 240-245 after being thrown from a horse and sustaining a back injury. Patient lost another 20 lb 2 years ago when he had surgery for back injury. Patient reports that weight has been stable for 2 years.  Patient's wife believes patient has lost around 10 lbs since January. Patient's wife stated that documented weight history is not accurate due to type of scale used (bed) and when patient weighed on standing scale he had on heavy shoes and clothes. Using reported weights, patient with a 5% weight loss in 2 months.  Last BM was 3/6. Patient receiving docusate sodium BID. Currently NPO for colonoscopy  Glucose elevated at 111, currently trending down.  Nutrition Focused Physical Exam:  Subcutaneous Fat:  Orbital Region: moderate depletion Upper Arm Region: mild depletion Thoracic and Lumbar Region: N/A  Muscle:  Temple Region: WNL Clavicle Bone Region: mild depletion Clavicle and Acromion Bone Region: WNL Scapular Bone Region: N/A Dorsal Hand: WNL Patellar Region: WNL Anterior Thigh Region: WNL Posterior Calf Region: WNL  Edema: none noted  Height: Ht Readings from Last 1 Encounters:  07/18/13 5\' 11"  (1.803 m)    Weight: Wt Readings from Last 1 Encounters:  07/19/13 214 lb 15.2 oz (97.5 kg)    Ideal Body Weight: 172 lb (78.2 kg)  % Ideal Body Weight: 124%  Wt Readings from Last 10 Encounters:  07/19/13 214 lb 15.2 oz (97.5 kg)  07/18/13  222 lb (100.699 kg)  03/16/13 223 lb (101.152 kg)  07/30/11 225 lb (102.059 kg)    Usual Body Weight: 220 lb (100 kg)  % Usual Body Weight: 97%  BMI:  Body mass index is 29.99 kg/(m^2).  Estimated Nutritional Needs: Kcal: 1900-2100 Protein: 90-100 Fluid: 1.9-2.1  L  Skin: no wounds  Diet Order: NPO  EDUCATION NEEDS: -No education needs identified at this time   Intake/Output Summary (Last 24 hours) at 07/19/13 0931 Last data filed at 07/19/13 0706  Gross per 24 hour  Intake    899 ml  Output    600 ml  Net    299 ml    Last BM: 3/6-patient receiving docusate sodium BID  Labs:   Recent Labs Lab 07/18/13 1940 07/19/13 0338  NA 138 139  K 4.5 3.7  CL 99 101  CO2 27 24  BUN 10 10  CREATININE 0.89 0.83  CALCIUM 9.6 8.8  MG 2.2  --   PHOS 2.7  --   GLUCOSE 142* 111*    CBG (last 3)  No results found for this basename: GLUCAP,  in the last 72 hours  Scheduled Meds: . ALPRAZolam  1.5 mg Oral QHS  . ciprofloxacin  400 mg Intravenous Q12H  . docusate sodium  100 mg Oral BID  . metronidazole  500 mg Intravenous Q8H  . nicotine  21 mg Transdermal Daily  . [START ON 07/31/2013] testosterone cypionate  200 mg Intramuscular Q14 Days    Continuous Infusions:   Past Medical History  Diagnosis Date  . Compression fracture     vertebrae  . Anxiety     Past Surgical History  Procedure Laterality Date  . Spine surgery  7/13    T-12    Claudell Kyle, Dietetic Intern Pager: [phone]  I have reviewed and agree with nutrition assessment completed by Claudell Kyle, [place] LDN Clinical Dietitian [phone]

## 2013-07-19 NOTE — Consult Note (Signed)
[facility] Gastroenterology Consultation Note  Referring Provider: Dr. Leisa Lenz [facility]) Primary Care Physician:  [name], MD  Reason for Consultation:  Abnormal CT scan  HPI: Luke Miller is a 48 y.o. male admitted for, and whom we've been asked to see on behalf of, abdominal pain and nausea and vomiting.  For the past couple months, patient has had intermittent crampy periumbilical abdominal pain and intermittent bouts of nausea and vomiting.  Also intermittent bouts of fevers.  Had attributed symptoms at first to infectious illness experienced by his grandchildren.  No blood in stool.  Has lost about 10 lbs, fluctuations up or down of which are not unusual for him.  No prior colonoscopy.  Grandfather died from colon cancer.  Patient had CT scan showing thickening of splenic flexure.   Past Medical History  Diagnosis Date  . Compression fracture     vertebrae  . Anxiety     Past Surgical History  Procedure Laterality Date  . Spine surgery  7/13    T-12    Prior to Admission medications   Medication Sig Start Date End Date Taking? Authorizing Provider  ALPRAZolam Duanne Moron) 0.5 MG tablet Take 1.5 mg by mouth at bedtime.   Yes Historical Provider, MD  oxyCODONE (ROXICODONE) 15 MG immediate release tablet Take 15 mg by mouth every 4 (four) hours as needed for pain.  03/09/13  Yes Historical Provider, MD  promethazine (PHENERGAN) 12.5 MG suppository Place 1 suppository (12.5 mg total) rectally every 6 (six) hours as needed for nausea or vomiting. 07/03/13  Yes Mary-Margaret Hassell Done, FNP  testosterone cypionate (DEPO-TESTOSTERONE) 200 MG/ML injection Inject 1 mL (200 mg total) into the muscle every 14 (fourteen) days. 02/16/13  Yes Chipper Herb, MD    Current Facility-Administered Medications  Medication Dose Route Frequency Provider Last Rate Last Dose  . acetaminophen (TYLENOL) tablet 650 mg  650 mg Oral Q6H PRN Robbie Lis, MD       Or  . acetaminophen (TYLENOL) suppository 650 mg   650 mg Rectal Q6H PRN Robbie Lis, MD      . ALPRAZolam Duanne Moron) tablet 1.5 mg  1.5 mg Oral QHS Robbie Lis, MD   1.5 mg at 07/18/13 2112  . ciprofloxacin (CIPRO) IVPB 400 mg  400 mg Intravenous Q12H Robbie Lis, MD   400 mg at 07/19/13 [phone]  . docusate sodium (COLACE) capsule 100 mg  100 mg Oral BID Robbie Lis, MD   100 mg at 07/19/13 0948  . hydrALAZINE (APRESOLINE) injection 10 mg  10 mg Intravenous Q6H PRN Robbie Lis, MD      . metroNIDAZOLE (FLAGYL) IVPB 500 mg  500 mg Intravenous Q8H Robbie Lis, MD   500 mg at 07/19/13 0949  . morphine 2 MG/ML injection 1 mg  1 mg Intravenous Q3H PRN Robbie Lis, MD   1 mg at 07/19/13 0954  . nicotine (NICODERM CQ - dosed in mg/24 hours) patch 21 mg  21 mg Transdermal Daily Rhetta Mura Schorr, NP   21 mg at 07/19/13 0948  . ondansetron (ZOFRAN) tablet 4 mg  4 mg Oral Q6H PRN Robbie Lis, MD       Or  . ondansetron [facility]) injection 4 mg  4 mg Intravenous Q6H PRN Robbie Lis, MD   4 mg at 07/18/13 1902  . oxyCODONE (Oxy IR/ROXICODONE) immediate release tablet 15 mg  15 mg Oral Q4H PRN Robbie Lis, MD   15 mg at  07/18/13 2112  . promethazine (PHENERGAN) suppository 12.5 mg  12.5 mg Rectal Q6H PRN Robbie Lis, MD      . Derrill Memo ON 07/31/2013] testosterone cypionate (DEPOTESTOTERONE CYPIONATE) injection 200 mg  200 mg Intramuscular Q14 Days Robbie Lis, MD        Allergies as of 07/18/2013  . (No Known Allergies)    Family History  Problem Relation Age of Onset  . Cancer Maternal Grandmother     lung  . Cancer Maternal Grandfather     colon    History   Social History  . Marital Status: Married    Spouse Name: N/A    Number of Children: N/A  . Years of Education: N/A   Occupational History  . Not on file.   Social History Main Topics  . Smoking status: Never Smoker   . Smokeless tobacco: Current User  . Alcohol Use: No  . Drug Use: No  . Sexual Activity: Yes   Other Topics Concern  . Not on file   Social  History Narrative  . No narrative on file    Review of Systems: ROS Dr. Charlies Silvers 07/18/13 reviewed and I agree  Physical Exam: Vital signs in last 24 hours: Temp:  [98.3 F (36.8 C)-99.7 F (37.6 C)] 98.3 F (36.8 C) (03/11 0705) Pulse Rate:  [69-80] 69 (03/11 0705) Resp:  [18-20] 20 (03/11 0705) BP: (152-158)/(86-94) 157/86 mmHg (03/11 0705) SpO2:  [100 %] 100 % (03/11 0705) Weight:  [96.3 kg (212 lb 4.9 oz)-97.5 kg (214 lb 15.2 oz)] 97.5 kg (214 lb 15.2 oz) (03/11 0705) Last BM Date: 07/14/13 General:   Alert,  Well-developed, well-nourished, pleasant and cooperative in NAD Head:  Normocephalic and atraumatic. Eyes:  Sclera clear, no icterus.   Conjunctiva pink. Ears:  Normal auditory acuity. Nose:  No deformity, discharge,  or lesions. Mouth:  No deformity or lesions.  Oropharynx pink & moist. Neck:  Supple; no masses or thyromegaly. Lungs:  Clear throughout to auscultation.   No wheezes, crackles, or rhonchi. No acute distress. Heart:  Regular rate and rhythm; no murmurs, clicks, rubs,  or gallops. Abdomen:  Soft, nontender and nondistended. No masses, hepatosplenomegaly or hernias noted. Normal bowel sounds, without guarding, and without rebound.     Msk:  Symmetrical without gross deformities. Normal posture. Pulses:  Normal pulses noted. Extremities:  Without clubbing or edema. Neurologic:  Alert and  oriented x4;  grossly normal neurologically. Skin:  Intact without significant lesions or rashes. Psych:  Alert and cooperative. Normal mood and affect.   Lab Results:  Recent Labs  07/18/13 1940 07/19/13 0338  WBC 8.7 7.3  HGB 13.0 12.2*  HCT 40.4 37.9*  PLT 351 327   BMET  Recent Labs  07/18/13 1940 07/19/13 0338  NA 138 139  K 4.5 3.7  CL 99 101  CO2 27 24  GLUCOSE 142* 111*  BUN 10 10  CREATININE 0.89 0.83  CALCIUM 9.6 8.8   LFT  Recent Labs  07/19/13 0338  PROT 6.8  ALBUMIN 2.9*  AST 8  ALT 6  ALKPHOS 66  BILITOT 0.3   PT/INR  Recent  Labs  07/18/13 1940  LABPROT 13.9  INR 1.09    Studies/Results: Ct Abdomen Pelvis Wo Contrast  07/18/2013   CLINICAL DATA Abdominal pain and cramping, weight loss  EXAM CT ABDOMEN AND PELVIS WITHOUT CONTRAST  TECHNIQUE Multidetector CT imaging of the abdomen and pelvis was performed following the standard protocol without IV contrast.  COMPARISON None.  FINDINGS Lung bases clear. Normal heart size. No pericardial or pleural effusion.  Abdomen: Kidneys demonstrate no acute obstruction, hydronephrosis, or obstructing ureteral calculus on either side.  Liver, gallbladder, biliary system, pancreas, spleen, and adrenal glands are within normal limits for age and noncontrast imaging.  No abdominal free fluid, fluid collection, hemorrhage, or abscess.  In the left upper quadrant, the splenic flexure of the colon demonstrates an approximately 11 cm segment of abnormal bowel wall thickening with surrounding strandy pericolonic edema. Small adjacent mesenteric lymph nodes noted, measuring 5 mm or less in size. Appearance is nonspecific by compatible with a short segment of acute colitis versus a colonic malignancy. Proximal to this, there is a large amount of retained stool within the cecum, right colon, and transverse colon. Transverse colon is also moderately distended with air. This appears to represent some degree of partial colonic obstruction. Small bowel is not dilated.  Pelvis: No pelvic free fluid, fluid collection, hemorrhage, abscess, adenopathy, inguinal abnormality, or hernia. Urinary bladder collapsed. Minor distal colon diverticulosis. Distal colon is collapsed. Prostate gland is mildly enlarged.  Degenerative changes of the spine. Previous T12 corpectomy with fusion. No definite acute osseous finding.  IMPRESSION Abnormal splenic flexure of the colon in the left upper quadrant with wall thickening, surrounding pericolonic strandy edema and prominent adjacent nonenlarged lymph nodes compatible with  either acute colitis versus a colonic malignancy.  Proximal colon is distended with retained formed stool and air, compatible with some degree of partial colonic obstruction.  No free air or abscess  These results were called by telephone at the time of interpretation on 07/18/2013 at 2:34 PM to Dr. [name] , who verbally acknowledged these results.  SIGNATURE  Electronically Signed   By: Daryll Brod M.D.   On: 07/18/2013 14:36   Dg Chest 2 View  07/18/2013   CLINICAL DATA Nausea and weight loss.  EXAM CHEST - 2 VIEW  COMPARISON None  FINDINGS The heart size and mediastinal contours are within normal limits. There is no evidence of pulmonary edema, consolidation, pneumothorax, nodule or pleural fluid. There is evidence of prior spinal fusion and corpectomy in the lower thoracic spine/upper lumbar spine.  IMPRESSION No active disease.  SIGNATURE  Electronically Signed   By: Aletta Edouard M.D.   On: 07/18/2013 13:33   Dg Abd 1 View  07/18/2013   CLINICAL DATA:  Nausea and weight loss  EXAM: ABDOMEN - 1 VIEW  COMPARISON:  None.  FINDINGS: The upper abdomen is not completely included in these images. The bowel gas pattern is nonobstructive. There are postsurgical changes of fusion spanning T12 through L2.  IMPRESSION: Nonobstructive bowel gas pattern. The upper abdomen is not completely included in the images.   Electronically Signed   By: Curlene Dolphin M.D.   On: 07/18/2013 11:58   Impression:  1.  Abdominal pain, intermittent. 2.  Nausea and vomiting, intermittent. 3.  Microcytic anemia. 4.  Fevers, intermittent. 5.  Abnormal CT scan, splenic flexure thickening.  Plan:  1.  Colonoscopy tomorrow.  Hopefully can tolerate slow peroral bowel prep; if not, will have to do enemas instead. 2.  Risks (bleeding, infection, bowel perforation that could require surgery, sedation-related changes in cardiopulmonary systems), benefits (identification and possible treatment of source of symptoms, exclusion  of certain causes of symptoms), and alternatives (watchful waiting, radiographic imaging studies, empiric medical treatment) of colonoscopy were explained to patient/family in detail and patient wishes to proceed. 3.  Next step in management  is pending colonoscopy findings.   LOS: 1 day   Michaela Broski M  07/19/2013, 12:08 PM

## 2013-07-19 NOTE — Progress Notes (Signed)
PROGRESS NOTE   Luke Miller UMP:[id number] DOB: 07-10-1965 DOA: 07/18/2013 PCP: [name], MD  Brief narrative: Luke Miller is an 48 y.o. male with a PMH of testosterone deficiency and anxiety who presented from his PCP office 07/18/13 with a six-week history of worsening abdominal pain, nausea and nonbloody vomiting in the setting of GI 6 contacts. He has also lost approximately 10 pounds since his illness began. A CT scan done on admission showed thickening of the splenic flexure.  Assessment/Plan: Principal Problem:   Colitis CT scan findings consistent with thickening of the splenic flexure of uncertain etiology with impacted stool. GI consultation done 07/19/13 with plans for colonoscopy 07/20/13. Continue empiric Cipro and Flagyl for now. Active Problems:   Low TSH Check a free T4.   Testosterone deficiency Continue testosterone hormone replacement every 2 weeks.   Gen. anxiety disorder Continue Xanax each bedtime.   DVT Prophylaxis Continue SCDs.  Code Status: Full. Family Communication: No family at the bedside. Disposition Plan: Home when stable.   IV access:  Peripheral IV  Medical Consultants:  Dr. Arta Silence, Gastroenterology  Other Consultants:  None  Anti-infectives:  Cipro 07/18/13--->  Flagyl 07/18/13--->  HPI/Subjective: Luke Miller feels well at the moment. He denies any abdominal pain or cramping, no nausea or vomiting overnight, no loose stools. He has begun his bowel prep.  Objective: Filed Vitals:   07/18/13 1748 07/18/13 2058 07/19/13 0705  BP: 152/87 158/94 157/86  Pulse: 71 80 69  Temp: 99.7 F (37.6 C) 98.8 F (37.1 C) 98.3 F (36.8 C)  TempSrc: Oral Oral Oral  Resp: 18 20 20   Height: 5\' 11"  (1.803 m)    Weight: 96.3 kg (212 lb 4.9 oz)  97.5 kg (214 lb 15.2 oz)  SpO2: 100% 100% 100%    Intake/Output Summary (Last 24 hours) at 07/19/13 1253 Last data filed at 07/19/13 1000  Gross per 24 hour  Intake    899 ml    Output    950 ml  Net    -51 ml    Exam: Gen:  NAD Cardiovascular:  RRR, No M/R/G Respiratory:  Lungs CTAB Gastrointestinal:  Abdomen soft, NT/ND, + BS Extremities:  No C/E/C  Data Reviewed: Basic Metabolic Panel:  Recent Labs Lab 07/18/13 1940 07/19/13 0338  NA 138 139  K 4.5 3.7  CL 99 101  CO2 27 24  GLUCOSE 142* 111*  BUN 10 10  CREATININE 0.89 0.83  CALCIUM 9.6 8.8  MG 2.2  --   PHOS 2.7  --    GFR Estimated Creatinine Clearance: 129.6 ml/min (by C-G formula based on Cr of 0.83). Liver Function Tests:  Recent Labs Lab 07/18/13 1940 07/19/13 0338  AST 10 8  ALT 7 6  ALKPHOS 76 66  BILITOT 0.3 0.3  PROT 7.7 6.8  ALBUMIN 3.3* 2.9*   Coagulation profile  Recent Labs Lab 07/18/13 1940  INR 1.09    CBC:  Recent Labs Lab 07/18/13 1940 07/19/13 0338  WBC 8.7 7.3  NEUTROABS 7.9*  --   HGB 13.0 12.2*  HCT 40.4 37.9*  MCV 75.1* 75.6*  PLT 351 327   CBG:  Recent Labs Lab 07/19/13 0800  GLUCAP 115*   Thyroid function studies  Recent Labs  07/18/13 1940  TSH 0.267*   Microbiology No results found for this or any previous visit (from the past 240 hour(s)).   Procedures and Diagnostic Studies: Ct Abdomen Pelvis Wo Contrast  07/18/2013   CLINICAL DATA Abdominal  pain and cramping, weight loss  EXAM CT ABDOMEN AND PELVIS WITHOUT CONTRAST  TECHNIQUE Multidetector CT imaging of the abdomen and pelvis was performed following the standard protocol without IV contrast.  COMPARISON None.  FINDINGS Lung bases clear. Normal heart size. No pericardial or pleural effusion.  Abdomen: Kidneys demonstrate no acute obstruction, hydronephrosis, or obstructing ureteral calculus on either side.  Liver, gallbladder, biliary system, pancreas, spleen, and adrenal glands are within normal limits for age and noncontrast imaging.  No abdominal free fluid, fluid collection, hemorrhage, or abscess.  In the left upper quadrant, the splenic flexure of the colon  demonstrates an approximately 11 cm segment of abnormal bowel wall thickening with surrounding strandy pericolonic edema. Small adjacent mesenteric lymph nodes noted, measuring 5 mm or less in size. Appearance is nonspecific by compatible with a short segment of acute colitis versus a colonic malignancy. Proximal to this, there is a large amount of retained stool within the cecum, right colon, and transverse colon. Transverse colon is also moderately distended with air. This appears to represent some degree of partial colonic obstruction. Small bowel is not dilated.  Pelvis: No pelvic free fluid, fluid collection, hemorrhage, abscess, adenopathy, inguinal abnormality, or hernia. Urinary bladder collapsed. Minor distal colon diverticulosis. Distal colon is collapsed. Prostate gland is mildly enlarged.  Degenerative changes of the spine. Previous T12 corpectomy with fusion. No definite acute osseous finding.  IMPRESSION Abnormal splenic flexure of the colon in the left upper quadrant with wall thickening, surrounding pericolonic strandy edema and prominent adjacent nonenlarged lymph nodes compatible with either acute colitis versus a colonic malignancy.  Proximal colon is distended with retained formed stool and air, compatible with some degree of partial colonic obstruction.  No free air or abscess  These results were called by telephone at the time of interpretation on 07/18/2013 at 2:34 PM to Dr. [name] , who verbally acknowledged these results.  SIGNATURE  Electronically Signed   By: Daryll Brod M.D.   On: 07/18/2013 14:36   Dg Chest 2 View  07/18/2013   CLINICAL DATA Nausea and weight loss.  EXAM CHEST - 2 VIEW  COMPARISON None  FINDINGS The heart size and mediastinal contours are within normal limits. There is no evidence of pulmonary edema, consolidation, pneumothorax, nodule or pleural fluid. There is evidence of prior spinal fusion and corpectomy in the lower thoracic spine/upper lumbar spine.   IMPRESSION No active disease.  SIGNATURE  Electronically Signed   By: Aletta Edouard M.D.   On: 07/18/2013 13:33   Dg Abd 1 View  07/18/2013   CLINICAL DATA:  Nausea and weight loss  EXAM: ABDOMEN - 1 VIEW  COMPARISON:  None.  FINDINGS: The upper abdomen is not completely included in these images. The bowel gas pattern is nonobstructive. There are postsurgical changes of fusion spanning T12 through L2.  IMPRESSION: Nonobstructive bowel gas pattern. The upper abdomen is not completely included in the images.   Electronically Signed   By: Curlene Dolphin M.D.   On: 07/18/2013 11:58    Scheduled Meds: . ALPRAZolam  1.5 mg Oral QHS  . bisacodyl  10 mg Oral Once  . ciprofloxacin  400 mg Intravenous Q12H  . docusate sodium  100 mg Oral BID  . metronidazole  500 mg Intravenous Q8H  . nicotine  21 mg Transdermal Daily  . [START ON 07/20/2013] polyethylene glycol-electrolytes  2,000 mL Oral Once  . polyethylene glycol-electrolytes  4,000 mL Oral Once  . [START ON 07/31/2013] testosterone cypionate  200 mg Intramuscular Q14 Days   Continuous Infusions:   Time spent: 25 minutes.   LOS: 1 day   Mysti Haley  [facility] Pager [phone]. If unable to reach me by pager, please call my cell phone at [phone].  *Please note that the hospitalists switch teams on Wednesdays. Please call the flow manager at [phone] if you are having difficulty reaching the hospitalist taking care of this patient as she can update you and provide the most up-to-date pager number of provider caring for the patient. If 8PM-8AM, please contact night-coverage at [email or web address], password [facility]  07/19/2013, 12:53 PM    **Disclaimer: This note was dictated with voice recognition software. Similar sounding words can inadvertently be transcribed and this note may contain transcription errors which may not have been corrected upon publication of note.**

## 2013-07-20 ENCOUNTER — Encounter (HOSPITAL_COMMUNITY)
Admission: AD | Disposition: A | Discharge status: Home / Self Care | Payer: Self-pay | Source: Ambulatory Visit | Attending: Internal Medicine

## 2013-07-20 ENCOUNTER — Encounter (HOSPITAL_COMMUNITY): Payer: Self-pay

## 2013-07-20 DIAGNOSIS — K56609 Unspecified intestinal obstruction, unspecified as to partial versus complete obstruction: Secondary | ICD-10-CM

## 2013-07-20 HISTORY — PX: COLONOSCOPY: SHX5424

## 2013-07-20 LAB — GLUCOSE, CAPILLARY: Glucose-Capillary: 99 mg/dL (ref 70–99)

## 2013-07-20 LAB — PROTIME-INR
INR: 1.24 (ref 0.00–1.49)
Prothrombin Time: 15.3 seconds — ABNORMAL HIGH (ref 11.6–15.2)

## 2013-07-20 LAB — TYPE AND SCREEN
ABO/RH(D): A POS
Antibody Screen: NEGATIVE

## 2013-07-20 LAB — APTT: APTT: 33 s (ref 24–37)

## 2013-07-20 LAB — ABO/RH: ABO/RH(D): A POS

## 2013-07-20 LAB — MRSA PCR SCREENING: MRSA by PCR: NEGATIVE

## 2013-07-20 SURGERY — COLONOSCOPY
Anesthesia: Moderate Sedation

## 2013-07-20 MED ORDER — SPOT INK MARKER SYRINGE KIT
PACK | SUBMUCOSAL | Status: AC
  Filled 2013-07-20: qty 5

## 2013-07-20 MED ORDER — MIDAZOLAM HCL 5 MG/5ML IJ SOLN
INTRAMUSCULAR | Status: DC | PRN
  Administered 2013-07-20 (×5): 2 mg via INTRAVENOUS

## 2013-07-20 MED ORDER — DIPHENHYDRAMINE HCL 50 MG/ML IJ SOLN
INTRAMUSCULAR | Status: DC | PRN
  Administered 2013-07-20: 25 mg via INTRAVENOUS

## 2013-07-20 MED ORDER — ZOLPIDEM TARTRATE 5 MG PO TABS
5.0000 mg | ORAL_TABLET | Freq: Every evening | ORAL | Status: DC | PRN
  Administered 2013-07-20 – 2013-07-21 (×2): 5 mg via ORAL
  Filled 2013-07-20 (×2): qty 1

## 2013-07-20 MED ORDER — DIPHENHYDRAMINE HCL 50 MG/ML IJ SOLN
INTRAMUSCULAR | Status: AC
  Filled 2013-07-20: qty 1

## 2013-07-20 MED ORDER — FENTANYL CITRATE 0.05 MG/ML IJ SOLN
INTRAMUSCULAR | Status: DC | PRN
  Administered 2013-07-20 (×4): 25 ug via INTRAVENOUS

## 2013-07-20 MED ORDER — SPOT INK MARKER SYRINGE KIT
PACK | SUBMUCOSAL | Status: DC | PRN
  Administered 2013-07-20: 4 mL via SUBMUCOSAL

## 2013-07-20 MED ORDER — FENTANYL CITRATE 0.05 MG/ML IJ SOLN
INTRAMUSCULAR | Status: AC
  Filled 2013-07-20: qty 2

## 2013-07-20 MED ORDER — SODIUM CHLORIDE 0.9 % IV SOLN: INTRAVENOUS | Status: DC

## 2013-07-20 MED ORDER — MIDAZOLAM HCL 10 MG/2ML IJ SOLN
INTRAMUSCULAR | Status: AC
  Filled 2013-07-20: qty 2

## 2013-07-20 MED ORDER — ALPRAZOLAM 0.5 MG PO TABS
0.5000 mg | ORAL_TABLET | Freq: Once | ORAL | Status: AC | PRN
  Filled 2013-07-20: qty 1

## 2013-07-20 NOTE — Op Note (Signed)
[facility] [place], [address]   COLONOSCOPY PROCEDURE REPORT  PATIENT: Luke Miller, Luke Miller  MR#: [id number] BIRTHDATE: [birth year] , 47  yrs. old GENDER: Male ENDOSCOPIST: Arta Silence, MD REFERRED XB:[id number] PROCEDURE DATE:  07/20/2013 PROCEDURE:   Colonoscopy with biopsy and snare polypectomy and Submucosal injection, any substance ASA CLASS:   Class II INDICATIONS:abnormal CT scan (focal colonic thickening), microcytic anemia. MEDICATIONS: Fentanyl 100 mcg IV, Versed 10 mg IV, and Benadryl 25 mg IV  DESCRIPTION OF PROCEDURE:   After the risks benefits and alternatives of the procedure were thoroughly explained, informed consent was obtained.  A digital rectal exam revealed no abnormalities of the rectum.   The Pentax Adult Colonscope [id number] endoscope was introduced through the anus and advanced to the mid transverse colon. No adverse events experienced.   The quality of the prep was fair.  The instrument was then slowly withdrawn as the colon was fully examined.     Findings:  Digital rectal exam was normal.  Prep quality was fair; semisolid stool obscured some views throughout the examined colon, and lesions less than 42mm could have been missed.  At level of the mid transverse colon, a friable, ulcerated, fixed and completely circumferential mass was seen.  There was a very tiny residual lumen through which the colonoscope could not pass.  Lesion site was tattooed via submucosal injection of [place] Ink.  The tumor was biopsied extensively with cold biopsy forceps.   Few sigmoid diverticula seen.  54mm pedunculated polyp in sigmoid colon was removed with snare cautery.  Retroflexion into rectum was normal.       .  The scope was withdrawn and the procedure completed.  ENDOSCOPIC IMPRESSION:     As above. Near-completely obstructing mass in mid transverse colon.  RECOMMENDATIONS:     1.  Watch for potential complications  of procedure. 2.  Surgical consultation. 3.  CEA level. 4.  Clear liquids only.  eSigned:  Arta Silence, MD 07/20/2013 3:17 PM   cc:

## 2013-07-20 NOTE — Care Management Note (Signed)
   CARE MANAGEMENT NOTE 07/20/2013  Patient:  Peer,Jarquavious   Account Number:  [id number]  Date Initiated:  07/20/2013  Documentation initiated by:  Miyoshi Ligas  Subjective/Objective Assessment:   48 yo male admitted with colitis.PCP: [name], MD     Action/Plan:   Home when stable   Anticipated DC Date:     Anticipated DC Plan:  [place]  CM consult      Choice offered to / List presented to:  NA   DME arranged  NA      DME agency  NA     [place] arranged  NA      [place] agency  NA   Status of service:  Completed, signed off Medicare Important Message given?   (If response is "NO", the following Medicare IM given date fields will be blank) Date Medicare IM given:   Date Additional Medicare IM given:    Discharge Disposition:    Per UR Regulation:  Reviewed for med. necessity/level of care/duration of stay  If discussed at [place] of Stay Meetings, dates discussed:    Comments:  07/20/13 Mayer Damel Querry,MSN,RN [phone] Chart reviewed for utilization of services. No needs assessed at this time. identified PCP and pharmacy. PTA pt independent.

## 2013-07-20 NOTE — Progress Notes (Signed)
PROGRESS NOTE   Tag Wurtz OFB:[id number] DOB: 11/10/1965 DOA: 07/18/2013 PCP: [name], MD  Brief narrative: Luke Miller is an 48 y.o. male with a PMH of testosterone deficiency and anxiety who presented from his PCP office 07/18/13 with a six-week history of worsening abdominal pain, nausea and nonbloody vomiting in the setting of GI 6 contacts. He has also lost approximately 10 pounds since his illness began. A CT scan done on admission showed thickening of the splenic flexure.  Assessment/Plan: Principal Problem:   Colitis CT scan findings consistent with thickening of the splenic flexure of uncertain etiology with impacted stool. GI consultation done 07/19/13 with plans for colonoscopy today. Continue empiric Cipro and Flagyl for now. Active Problems:   Low TSH Free T4 within normal limits.   Testosterone deficiency Continue testosterone hormone replacement every 2 weeks.   Gen. anxiety disorder Continue Xanax each bedtime.   DVT Prophylaxis Continue SCDs.  Code Status: Full. Family Communication: Wife updated at the bedside. Disposition Plan: Home when stable.   IV access:  Peripheral IV  Medical Consultants:  Dr. Arta Silence, Gastroenterology  Other Consultants:  None  Anti-infectives:  Cipro 07/18/13--->  Flagyl 07/18/13--->  HPI/Subjective: Luke Miller feels well at the moment. He had some transient nausea after he started his bowel prep last night, but no nausea now and no vomiting. Had minimal abdominal cramping.  Objective: Filed Vitals:   07/19/13 0705 07/19/13 1500 07/20/13 0127 07/20/13 0535  BP: 157/86 149/93 145/93 124/70  Pulse: 69 70 69 55  Temp: 98.3 F (36.8 C) 98.1 F (36.7 C) 98.4 F (36.9 C) 98.2 F (36.8 C)  TempSrc: Oral Oral Oral Oral  Resp: 20 18 20 20   Height:      Weight: 97.5 kg (214 lb 15.2 oz)   96.5 kg (212 lb 11.9 oz)  SpO2: 100% 100% 100% 98%    Intake/Output Summary (Last 24 hours) at 07/20/13 1136 Last  data filed at 07/20/13 0531  Gross per 24 hour  Intake   2394 ml  Output    300 ml  Net   2094 ml    Exam: Gen:  NAD Cardiovascular:  RRR, No M/R/G Respiratory:  Lungs CTAB Gastrointestinal:  Abdomen soft, NT/ND, + BS Extremities:  No C/E/C  Data Reviewed: Basic Metabolic Panel:  Recent Labs Lab 07/18/13 1940 07/19/13 0338  NA 138 139  K 4.5 3.7  CL 99 101  CO2 27 24  GLUCOSE 142* 111*  BUN 10 10  CREATININE 0.89 0.83  CALCIUM 9.6 8.8  MG 2.2  --   PHOS 2.7  --    GFR Estimated Creatinine Clearance: 129 ml/min (by C-G formula based on Cr of 0.83). Liver Function Tests:  Recent Labs Lab 07/18/13 1940 07/19/13 0338  AST 10 8  ALT 7 6  ALKPHOS 76 66  BILITOT 0.3 0.3  PROT 7.7 6.8  ALBUMIN 3.3* 2.9*   Coagulation profile  Recent Labs Lab 07/18/13 1940  INR 1.09    CBC:  Recent Labs Lab 07/18/13 1940 07/19/13 0338  WBC 8.7 7.3  NEUTROABS 7.9*  --   HGB 13.0 12.2*  HCT 40.4 37.9*  MCV 75.1* 75.6*  PLT 351 327   CBG:  Recent Labs Lab 07/19/13 0800 07/20/13 0803  GLUCAP 115* 99   Thyroid function studies  Recent Labs  07/18/13 1940  TSH 0.267*   Microbiology No results found for this or any previous visit (from the past 240 hour(s)).   Procedures and  Diagnostic Studies: Ct Abdomen Pelvis Wo Contrast  07/18/2013   CLINICAL DATA Abdominal pain and cramping, weight loss  EXAM CT ABDOMEN AND PELVIS WITHOUT CONTRAST  TECHNIQUE Multidetector CT imaging of the abdomen and pelvis was performed following the standard protocol without IV contrast.  COMPARISON None.  FINDINGS Lung bases clear. Normal heart size. No pericardial or pleural effusion.  Abdomen: Kidneys demonstrate no acute obstruction, hydronephrosis, or obstructing ureteral calculus on either side.  Liver, gallbladder, biliary system, pancreas, spleen, and adrenal glands are within normal limits for age and noncontrast imaging.  No abdominal free fluid, fluid collection, hemorrhage,  or abscess.  In the left upper quadrant, the splenic flexure of the colon demonstrates an approximately 11 cm segment of abnormal bowel wall thickening with surrounding strandy pericolonic edema. Small adjacent mesenteric lymph nodes noted, measuring 5 mm or less in size. Appearance is nonspecific by compatible with a short segment of acute colitis versus a colonic malignancy. Proximal to this, there is a large amount of retained stool within the cecum, right colon, and transverse colon. Transverse colon is also moderately distended with air. This appears to represent some degree of partial colonic obstruction. Small bowel is not dilated.  Pelvis: No pelvic free fluid, fluid collection, hemorrhage, abscess, adenopathy, inguinal abnormality, or hernia. Urinary bladder collapsed. Minor distal colon diverticulosis. Distal colon is collapsed. Prostate gland is mildly enlarged.  Degenerative changes of the spine. Previous T12 corpectomy with fusion. No definite acute osseous finding.  IMPRESSION Abnormal splenic flexure of the colon in the left upper quadrant with wall thickening, surrounding pericolonic strandy edema and prominent adjacent nonenlarged lymph nodes compatible with either acute colitis versus a colonic malignancy.  Proximal colon is distended with retained formed stool and air, compatible with some degree of partial colonic obstruction.  No free air or abscess  These results were called by telephone at the time of interpretation on 07/18/2013 at 2:34 PM to Dr. [name] , who verbally acknowledged these results.  SIGNATURE  Electronically Signed   By: Daryll Brod M.D.   On: 07/18/2013 14:36   Dg Chest 2 View  07/18/2013   CLINICAL DATA Nausea and weight loss.  EXAM CHEST - 2 VIEW  COMPARISON None  FINDINGS The heart size and mediastinal contours are within normal limits. There is no evidence of pulmonary edema, consolidation, pneumothorax, nodule or pleural fluid. There is evidence of prior spinal  fusion and corpectomy in the lower thoracic spine/upper lumbar spine.  IMPRESSION No active disease.  SIGNATURE  Electronically Signed   By: Aletta Edouard M.D.   On: 07/18/2013 13:33   Dg Abd 1 View  07/18/2013   CLINICAL DATA:  Nausea and weight loss  EXAM: ABDOMEN - 1 VIEW  COMPARISON:  None.  FINDINGS: The upper abdomen is not completely included in these images. The bowel gas pattern is nonobstructive. There are postsurgical changes of fusion spanning T12 through L2.  IMPRESSION: Nonobstructive bowel gas pattern. The upper abdomen is not completely included in the images.   Electronically Signed   By: Curlene Dolphin M.D.   On: 07/18/2013 11:58    Scheduled Meds: . ALPRAZolam  1.5 mg Oral QHS  . ciprofloxacin  400 mg Intravenous Q12H  . docusate sodium  100 mg Oral BID  . feeding supplement (RESOURCE BREEZE)  1 Container Oral BID BM  . metronidazole  500 mg Intravenous Q8H  . nicotine  21 mg Transdermal Daily  . polyethylene glycol-electrolytes  2,000 mL Oral Once  . [  START ON 07/31/2013] testosterone cypionate  200 mg Intramuscular Q14 Days   Continuous Infusions:   Time spent: 25 minutes.   LOS: 2 days   Onetta Spainhower  [facility] Pager [phone]. If unable to reach me by pager, please call my cell phone at [phone].  *Please note that the hospitalists switch teams on Wednesdays. Please call the flow manager at [phone] if you are having difficulty reaching the hospitalist taking care of this patient as she can update you and provide the most up-to-date pager number of provider caring for the patient. If 8PM-8AM, please contact night-coverage at [email or web address], password [facility]  07/20/2013, 11:36 AM    **Disclaimer: This note was dictated with voice recognition software. Similar sounding words can inadvertently be transcribed and this note may contain transcription errors which may not have been corrected upon publication of note.**

## 2013-07-20 NOTE — H&P (View-Only) (Signed)
PROGRESS NOTE   Tag Luke Miller OFB:[id number] DOB: 11/10/1965 DOA: 07/18/2013 PCP: [name], MD  Brief narrative: Luke Miller is an 48 y.o. male with a PMH of testosterone deficiency and anxiety who presented from his PCP office 07/18/13 with a six-week history of worsening abdominal pain, nausea and nonbloody vomiting in the setting of GI 6 contacts. He has also lost approximately 10 pounds since his illness began. A CT scan done on admission showed thickening of the splenic flexure.  Assessment/Plan: Principal Problem:   Colitis CT scan findings consistent with thickening of the splenic flexure of uncertain etiology with impacted stool. GI consultation done 07/19/13 with plans for colonoscopy today. Continue empiric Cipro and Flagyl for now. Active Problems:   Low TSH Free T4 within normal limits.   Testosterone deficiency Continue testosterone hormone replacement every 2 weeks.   Gen. anxiety disorder Continue Xanax each bedtime.   DVT Prophylaxis Continue SCDs.  Code Status: Full. Family Communication: Wife updated at the bedside. Disposition Plan: Home when stable.   IV access:  Peripheral IV  Medical Consultants:  Dr. Arta Silence, Gastroenterology  Other Consultants:  None  Anti-infectives:  Cipro 07/18/13--->  Flagyl 07/18/13--->  HPI/Subjective: Luke Miller feels well at the moment. He had some transient nausea after he started his bowel prep last night, but no nausea now and no vomiting. Had minimal abdominal cramping.  Objective: Filed Vitals:   07/19/13 0705 07/19/13 1500 07/20/13 0127 07/20/13 0535  BP: 157/86 149/93 145/93 124/70  Pulse: 69 70 69 55  Temp: 98.3 F (36.8 C) 98.1 F (36.7 C) 98.4 F (36.9 C) 98.2 F (36.8 C)  TempSrc: Oral Oral Oral Oral  Resp: 20 18 20 20   Height:      Weight: 97.5 kg (214 lb 15.2 oz)   96.5 kg (212 lb 11.9 oz)  SpO2: 100% 100% 100% 98%    Intake/Output Summary (Last 24 hours) at 07/20/13 1136 Last  data filed at 07/20/13 0531  Gross per 24 hour  Intake   2394 ml  Output    300 ml  Net   2094 ml    Exam: Gen:  NAD Cardiovascular:  RRR, No M/R/G Respiratory:  Lungs CTAB Gastrointestinal:  Abdomen soft, NT/ND, + BS Extremities:  No C/E/C  Data Reviewed: Basic Metabolic Panel:  Recent Labs Lab 07/18/13 1940 07/19/13 0338  NA 138 139  K 4.5 3.7  CL 99 101  CO2 27 24  GLUCOSE 142* 111*  BUN 10 10  CREATININE 0.89 0.83  CALCIUM 9.6 8.8  MG 2.2  --   PHOS 2.7  --    GFR Estimated Creatinine Clearance: 129 ml/min (by C-G formula based on Cr of 0.83). Liver Function Tests:  Recent Labs Lab 07/18/13 1940 07/19/13 0338  AST 10 8  ALT 7 6  ALKPHOS 76 66  BILITOT 0.3 0.3  PROT 7.7 6.8  ALBUMIN 3.3* 2.9*   Coagulation profile  Recent Labs Lab 07/18/13 1940  INR 1.09    CBC:  Recent Labs Lab 07/18/13 1940 07/19/13 0338  WBC 8.7 7.3  NEUTROABS 7.9*  --   HGB 13.0 12.2*  HCT 40.4 37.9*  MCV 75.1* 75.6*  PLT 351 327   CBG:  Recent Labs Lab 07/19/13 0800 07/20/13 0803  GLUCAP 115* 99   Thyroid function studies  Recent Labs  07/18/13 1940  TSH 0.267*   Microbiology No results found for this or any previous visit (from the past 240 hour(s)).   Procedures and  Diagnostic Studies: Ct Abdomen Pelvis Wo Contrast  07/18/2013   CLINICAL DATA Abdominal pain and cramping, weight loss  EXAM CT ABDOMEN AND PELVIS WITHOUT CONTRAST  TECHNIQUE Multidetector CT imaging of the abdomen and pelvis was performed following the standard protocol without IV contrast.  COMPARISON None.  FINDINGS Lung bases clear. Normal heart size. No pericardial or pleural effusion.  Abdomen: Kidneys demonstrate no acute obstruction, hydronephrosis, or obstructing ureteral calculus on either side.  Liver, gallbladder, biliary system, pancreas, spleen, and adrenal glands are within normal limits for age and noncontrast imaging.  No abdominal free fluid, fluid collection, hemorrhage,  or abscess.  In the left upper quadrant, the splenic flexure of the colon demonstrates an approximately 11 cm segment of abnormal bowel wall thickening with surrounding strandy pericolonic edema. Small adjacent mesenteric lymph nodes noted, measuring 5 mm or less in size. Appearance is nonspecific by compatible with a short segment of acute colitis versus a colonic malignancy. Proximal to this, there is a large amount of retained stool within the cecum, right colon, and transverse colon. Transverse colon is also moderately distended with air. This appears to represent some degree of partial colonic obstruction. Small bowel is not dilated.  Pelvis: No pelvic free fluid, fluid collection, hemorrhage, abscess, adenopathy, inguinal abnormality, or hernia. Urinary bladder collapsed. Minor distal colon diverticulosis. Distal colon is collapsed. Prostate gland is mildly enlarged.  Degenerative changes of the spine. Previous T12 corpectomy with fusion. No definite acute osseous finding.  IMPRESSION Abnormal splenic flexure of the colon in the left upper quadrant with wall thickening, surrounding pericolonic strandy edema and prominent adjacent nonenlarged lymph nodes compatible with either acute colitis versus a colonic malignancy.  Proximal colon is distended with retained formed stool and air, compatible with some degree of partial colonic obstruction.  No free air or abscess  These results were called by telephone at the time of interpretation on 07/18/2013 at 2:34 PM to Dr. [name] , who verbally acknowledged these results.  SIGNATURE  Electronically Signed   By: Daryll Brod M.D.   On: 07/18/2013 14:36   Dg Chest 2 View  07/18/2013   CLINICAL DATA Nausea and weight loss.  EXAM CHEST - 2 VIEW  COMPARISON None  FINDINGS The heart size and mediastinal contours are within normal limits. There is no evidence of pulmonary edema, consolidation, pneumothorax, nodule or pleural fluid. There is evidence of prior spinal  fusion and corpectomy in the lower thoracic spine/upper lumbar spine.  IMPRESSION No active disease.  SIGNATURE  Electronically Signed   By: Aletta Edouard M.D.   On: 07/18/2013 13:33   Dg Abd 1 View  07/18/2013   CLINICAL DATA:  Nausea and weight loss  EXAM: ABDOMEN - 1 VIEW  COMPARISON:  None.  FINDINGS: The upper abdomen is not completely included in these images. The bowel gas pattern is nonobstructive. There are postsurgical changes of fusion spanning T12 through L2.  IMPRESSION: Nonobstructive bowel gas pattern. The upper abdomen is not completely included in the images.   Electronically Signed   By: Curlene Dolphin M.D.   On: 07/18/2013 11:58    Scheduled Meds: . ALPRAZolam  1.5 mg Oral QHS  . ciprofloxacin  400 mg Intravenous Q12H  . docusate sodium  100 mg Oral BID  . feeding supplement (RESOURCE BREEZE)  1 Container Oral BID BM  . metronidazole  500 mg Intravenous Q8H  . nicotine  21 mg Transdermal Daily  . polyethylene glycol-electrolytes  2,000 mL Oral Once  . [  START ON 07/31/2013] testosterone cypionate  200 mg Intramuscular Q14 Days   Continuous Infusions:   Time spent: 25 minutes.   LOS: 2 days   RAMA,CHRISTINA  [facility] Pager [phone]. If unable to reach me by pager, please call my cell phone at [phone].  *Please note that the hospitalists switch teams on Wednesdays. Please call the flow manager at [phone] if you are having difficulty reaching the hospitalist taking care of this patient as she can update you and provide the most up-to-date pager number of provider caring for the patient. If 8PM-8AM, please contact night-coverage at [email or web address], password [facility]  07/20/2013, 11:36 AM    **Disclaimer: This note was dictated with voice recognition software. Similar sounding words can inadvertently be transcribed and this note may contain transcription errors which may not have been corrected upon publication of note.**

## 2013-07-20 NOTE — Consult Note (Signed)
Reason for Consult:  Colon obstruction Referring Physician:  Dr. Arta Silence  Luke Miller is an 49 y.o. male.  HPI: Pt is a healthy 48 y/o who has had problems with abdominal pain, nausea with large meals, intermittent fevers, some vomiting and some diarrhea.  His symptoms have been occuring since January 2015.  They have grandchildren, the oldest is age 79 and in 1st grade.  The entire family has been sick on and off with recurrent GI symptoms since January.  Pt related all his symptoms to that.  He finally saw Dr. Laurance Flatten on 07/18/13 with fatigue, fever, nausea some vomiting and no BM for 3 day.  He was admitted by Dr. Laurance Flatten to the Hospitalist Service.   Admission work up shows some mild anemia, decreased albumin.  Low TSH, normal T4, no influenza.  CT scan shows:  Abnormal splenic flexure of the colon in the left upper quadrant with wall thickening, surrounding pericolonic strandy edema and prominent adjacent nonenlarged lymph nodes compatible with either acute colitis versus a colonic malignancy. T12 surgery.  He was then seen and underwent colonoscopy by Dr. Paulita Fujita. Colonoscopy shows:  At level of the mid transverse colon, a friable, ulcerated, fixed and completely circumferential mass was seen. There was a very tiny residual lumen through which the colonoscope could not pass. Lesion site was tattooed via submucosal injection of [place] Ink. The tumor was biopsied extensively with cold biopsy forceps. Few sigmoid  diverticula seen. 40m pedunculated polyp in sigmoid colon was  removed with snare cautery. Retroflexion into rectum was normal.   With finding of almost complete obstruction we were ask to see.    Past Medical History  Diagnosis Date  Compression fracture thoracic With surgery, thoracic apporach and CT placement  BPH   Family hx of colon caner (grandfather in his 539's  On testosterone for insuffiencey   Vitamin D anemia   Anxiety     Past Surgical History  Procedure Laterality  Date  . Spine surgery  7/13    T-12 Post op CT placemement    Family History  Problem Relation Age of Onset  . Cancer Maternal Grandmother     lung  . Cancer Maternal Grandfather     colon    Social History:  reports that he has never smoked. He uses smokeless tobacco. He reports that he does not drink alcohol or use illicit drugs. He does not smoke, but uses chewing tobacco since age 48 ETOH: none DRugs:  None Works for [employer] Allergies: No Known Allergies  Home Medications:   Prior to Admission medications   Medication Sig Start Date End Date Taking? Authorizing Provider  ALPRAZolam (Duanne Moron 0.5 MG tablet Take 1.5 mg by mouth at bedtime.   Yes Historical Provider, MD  oxyCODONE (ROXICODONE) 15 MG immediate release tablet Take 15 mg by mouth every 4 (four) hours as needed for pain.  03/09/13  Yes Historical Provider, MD  promethazine (PHENERGAN) 12.5 MG suppository Place 1 suppository (12.5 mg total) rectally every 6 (six) hours as needed for nausea or vomiting. 07/03/13  Yes Mary-Margaret MHassell Done FNP  testosterone cypionate (DEPO-TESTOSTERONE) 200 MG/ML injection Inject 1 mL (200 mg total) into the muscle every 14 (fourteen) days. 02/16/13  Yes DChipper Herb MD     Prescriptions prior to admission  Medication Sig Dispense Refill  . ALPRAZolam (XANAX) 0.5 MG tablet Take 1.5 mg by mouth at bedtime.      .Marland KitchenoxyCODONE (ROXICODONE) 15 MG immediate release tablet Take  15 mg by mouth every 4 (four) hours as needed for pain.       . promethazine (PHENERGAN) 12.5 MG suppository Place 1 suppository (12.5 mg total) rectally every 6 (six) hours as needed for nausea or vomiting.  12 each  0  . testosterone cypionate (DEPO-TESTOSTERONE) 200 MG/ML injection Inject 1 mL (200 mg total) into the muscle every 14 (fourteen) days.  10 mL  2   Scheduled: . ALPRAZolam  1.5 mg Oral QHS  . ciprofloxacin  400 mg Intravenous Q12H  . docusate sodium  100 mg Oral BID  . feeding supplement  (RESOURCE BREEZE)  1 Container Oral BID BM  . metronidazole  500 mg Intravenous Q8H  . nicotine  21 mg Transdermal Daily  . polyethylene glycol-electrolytes  2,000 mL Oral Once  . [START ON 07/31/2013] testosterone cypionate  200 mg Intramuscular Q14 Days   Continuous: . sodium chloride 20 mL/hr at 07/20/13 1600   QIH:[id number], acetaminophen, ALPRAZolam, hydrALAZINE, morphine injection, ondansetron (ZOFRAN) IV, ondansetron, oxyCODONE, promethazine Anti-infectives   Start     Dose/Rate Route Frequency Ordered Stop   07/18/13 1830  ciprofloxacin (CIPRO) IVPB 400 mg     400 mg 200 mL/hr over 60 Minutes Intravenous Every 12 hours 07/18/13 1756     07/18/13 1830  metroNIDAZOLE (FLAGYL) IVPB 500 mg     500 mg 100 mL/hr over 60 Minutes Intravenous Every 8 hours 07/18/13 1756        Results for orders placed during the hospital encounter of 07/18/13 (from the past 48 hour(s))  COMPREHENSIVE METABOLIC PANEL     Status: Abnormal   Collection Time    07/18/13  7:40 PM      Result Value Ref Range   Sodium 138  137 - 147 mEq/L   Potassium 4.5  3.7 - 5.3 mEq/L   Chloride 99  96 - 112 mEq/L   CO2 27  19 - 32 mEq/L   Glucose, Bld 142 (*) 70 - 99 mg/dL   BUN 10  6 - 23 mg/dL   Creatinine, Ser 0.89  0.50 - 1.35 mg/dL   Calcium 9.6  8.4 - 10.5 mg/dL   Total Protein 7.7  6.0 - 8.3 g/dL   Albumin 3.3 (*) 3.5 - 5.2 g/dL   AST 10  0 - 37 U/L   ALT 7  0 - 53 U/L   Alkaline Phosphatase 76  39 - 117 U/L   Total Bilirubin 0.3  0.3 - 1.2 mg/dL   GFR calc non Af Amer >90  >90 mL/min   GFR calc Af Amer >90  >90 mL/min   Comment: (NOTE)     The eGFR has been calculated using the CKD EPI equation.     This calculation has not been validated in all clinical situations.     eGFR's persistently <90 mL/min signify possible Chronic Kidney     Disease.  MAGNESIUM     Status: None   Collection Time    07/18/13  7:40 PM      Result Value Ref Range   Magnesium 2.2  1.5 - 2.5 mg/dL  PHOSPHORUS      Status: None   Collection Time    07/18/13  7:40 PM      Result Value Ref Range   Phosphorus 2.7  2.3 - 4.6 mg/dL  CBC WITH DIFFERENTIAL     Status: Abnormal   Collection Time    07/18/13  7:40 PM      Result Value  Ref Range   WBC 8.7  4.0 - 10.5 K/uL   RBC 5.38  4.22 - 5.81 MIL/uL   Hemoglobin 13.0  13.0 - 17.0 g/dL   HCT 40.4  39.0 - 52.0 %   MCV 75.1 (*) 78.0 - 100.0 fL   MCH 24.2 (*) 26.0 - 34.0 pg   MCHC 32.2  30.0 - 36.0 g/dL   RDW 13.6  11.5 - 15.5 %   Platelets 351  150 - 400 K/uL   Neutrophils Relative % 91 (*) 43 - 77 %   Neutro Abs 7.9 (*) 1.7 - 7.7 K/uL   Lymphocytes Relative 7 (*) 12 - 46 %   Lymphs Abs 0.6 (*) 0.7 - 4.0 K/uL   Monocytes Relative 2 (*) 3 - 12 %   Monocytes Absolute 0.2  0.1 - 1.0 K/uL   Eosinophils Relative 0  0 - 5 %   Eosinophils Absolute 0.0  0.0 - 0.7 K/uL   Basophils Relative 0  0 - 1 %   Basophils Absolute 0.0  0.0 - 0.1 K/uL  APTT     Status: Abnormal   Collection Time    07/18/13  7:40 PM      Result Value Ref Range   aPTT 39 (*) 24 - 37 seconds   Comment:            IF BASELINE aPTT IS ELEVATED,     SUGGEST PATIENT RISK ASSESSMENT     BE USED TO DETERMINE APPROPRIATE     ANTICOAGULANT THERAPY.  PROTIME-INR     Status: None   Collection Time    07/18/13  7:40 PM      Result Value Ref Range   Prothrombin Time 13.9  11.6 - 15.2 seconds   INR 1.09  0.00 - 1.49  TSH     Status: Abnormal   Collection Time    07/18/13  7:40 PM      Result Value Ref Range   TSH 0.267 (*) 0.350 - 4.500 uIU/mL   Comment: Performed at [facility]     Status: Abnormal   Collection Time    07/19/13  3:38 AM      Result Value Ref Range   Sodium 139  137 - 147 mEq/L   Potassium 3.7  3.7 - 5.3 mEq/L   Comment: REPEATED TO VERIFY     DELTA CHECK NOTED   Chloride 101  96 - 112 mEq/L   CO2 24  19 - 32 mEq/L   Glucose, Bld 111 (*) 70 - 99 mg/dL   BUN 10  6 - 23 mg/dL   Creatinine, Ser 0.83  0.50 - 1.35 mg/dL    Calcium 8.8  8.4 - 10.5 mg/dL   Total Protein 6.8  6.0 - 8.3 g/dL   Albumin 2.9 (*) 3.5 - 5.2 g/dL   AST 8  0 - 37 U/L   ALT 6  0 - 53 U/L   Alkaline Phosphatase 66  39 - 117 U/L   Total Bilirubin 0.3  0.3 - 1.2 mg/dL   GFR calc non Af Amer >90  >90 mL/min   GFR calc Af Amer >90  >90 mL/min   Comment: (NOTE)     The eGFR has been calculated using the CKD EPI equation.     This calculation has not been validated in all clinical situations.     eGFR's persistently <90 mL/min signify possible Chronic Kidney     Disease.  CBC     Status: Abnormal   Collection Time    07/19/13  3:38 AM      Result Value Ref Range   WBC 7.3  4.0 - 10.5 K/uL   RBC 5.01  4.22 - 5.81 MIL/uL   Hemoglobin 12.2 (*) 13.0 - 17.0 g/dL   HCT 37.9 (*) 39.0 - 52.0 %   MCV 75.6 (*) 78.0 - 100.0 fL   MCH 24.4 (*) 26.0 - 34.0 pg   MCHC 32.2  30.0 - 36.0 g/dL   RDW 13.6  11.5 - 15.5 %   Platelets 327  150 - 400 K/uL  GLUCOSE, CAPILLARY     Status: Abnormal   Collection Time    07/19/13  8:00 AM      Result Value Ref Range   Glucose-Capillary 115 (*) 70 - 99 mg/dL   Comment 1 Documented in Chart     Comment 2 Notify RN    T4, FREE     Status: None   Collection Time    07/19/13  1:50 PM      Result Value Ref Range   Free T4 1.23  0.80 - 1.80 ng/dL   Comment: Performed at [place], CAPILLARY     Status: None   Collection Time    07/20/13  8:03 AM      Result Value Ref Range   Glucose-Capillary 99  70 - 99 mg/dL   Comment 1 Documented in Chart     Comment 2 Notify RN      No results found.  Review of Systems  Constitutional: Positive for fever and weight loss. Negative for chills (10 pounds over last month).  HENT: Negative.   Eyes: Negative.   Respiratory: Negative.   Cardiovascular: Negative.   Gastrointestinal: Positive for heartburn, nausea (after any large meal since January 2015.) and abdominal pain. Negative for vomiting (mid and upper abdomen), diarrhea, constipation, blood  in stool and melena.       He was having stools, but now unable to have a BM over the last week.  He thought he had good results from the Bowel prep before colonoscopy.  Genitourinary: Negative.  Flank pain: he had surgery for his back 2 years ago, back still hurts after work.    Musculoskeletal: Positive for back pain. Negative for falls, joint pain, myalgias and neck pain.  Skin: Negative.   Neurological: Negative.   Endo/Heme/Allergies: Negative.   Psychiatric/Behavioral: The patient is nervous/anxious.        His wife notes he wake up from anesthesia in a combative mode.     Blood pressure 130/76, pulse 65, temperature 98.4 F (36.9 C), temperature source Oral, resp. rate 16, height _0  (1.803 m), weight 96.5 kg (212 lb 11.9 oz), SpO2 98.00%. Physical Exam  Constitutional: He is oriented to person, place, and time. He appears well-developed and well-nourished. No distress.  HENT:  Head: Normocephalic and atraumatic.  Nose: Nose normal.  Eyes: Conjunctivae and EOM are normal. Pupils are equal, round, and reactive to light. Right eye exhibits no discharge. Left eye exhibits no discharge. No scleral icterus.  Neck: Normal range of motion. Neck supple. No JVD present. No tracheal deviation present. No thyromegaly present.  Cardiovascular: Normal rate, regular rhythm, normal heart sounds and intact distal pulses.  Exam reveals no gallop.   No murmur heard. Respiratory: Effort normal and breath sounds normal. No respiratory distress. He has no wheezes. He has no rales. He exhibits no  tenderness.  L CT scar, with prior back surgery  GI: Soft. Bowel sounds are normal. He exhibits no distension and no mass. There is no tenderness. There is no rebound and no guarding.  Musculoskeletal: He exhibits no edema and no tenderness.  Lymphadenopathy:    He has no cervical adenopathy.  Neurological: He is alert and oriented to person, place, and time. No cranial nerve deficit.  Skin: Skin is warm and  dry. No rash noted. He is not diaphoretic. No erythema. No pallor.  Psychiatric: He has a normal mood and affect. His behavior is normal. Judgment and thought content normal.    Assessment/Plan: 1.  Colon obstruction, weight loss with probable colon cancer, biopsy still pending. 2.  Anxiety 3.  Tobacco use 4.  Family history of colon, and lung cancer, Grandparents 31.  Testosterone deficiency 6.  Vitamin D diffiencey  Plan:  Labs, clear liquids, Dr. Rosendo Gros with see in AM and decided on surgery tomorrow AM.  Earnstine Regal 07/20/2013, 4:36 PM

## 2013-07-20 NOTE — Interval H&P Note (Signed)
History and Physical Interval Note:  07/20/2013 2:11 PM  Luke Miller  has presented today for surgery, with the diagnosis of abdominal pain, abnormal CT scan abdomen (splenic flexure thickening)  The various methods of treatment have been discussed with the patient and family. After consideration of risks, benefits and other options for treatment, the patient has consented to  Procedure(s): COLONOSCOPY (N/A) as a surgical intervention .  The patient's history has been reviewed, patient examined, no change in status, stable for surgery.  I have reviewed the patient's chart and labs.  Questions were answered to the patient's satisfaction.     Reno Clasby M  Assessment:  1.  Splenic flexure thickening on CT.  Plan:  1.  Colonoscopy. 2.  Risks (bleeding, infection, bowel perforation that could require surgery, sedation-related changes in cardiopulmonary systems), benefits (identification and possible treatment of source of symptoms, exclusion of certain causes of symptoms), and alternatives (watchful waiting, radiographic imaging studies, empiric medical treatment) of colonoscopy were explained to patient/family in detail and patient wishes to proceed.

## 2013-07-21 ENCOUNTER — Other Ambulatory Visit: Payer: Self-pay | Admitting: *Deleted

## 2013-07-21 ENCOUNTER — Encounter (HOSPITAL_COMMUNITY)
Admission: AD | Disposition: A | Discharge status: Home / Self Care | Payer: Self-pay | Source: Ambulatory Visit | Attending: Internal Medicine

## 2013-07-21 ENCOUNTER — Encounter (HOSPITAL_COMMUNITY): Payer: Self-pay | Admitting: Gastroenterology

## 2013-07-21 ENCOUNTER — Inpatient Hospital Stay
Admit: 2013-07-21 | Discharge: 2013-07-21 | Disposition: A | Discharge status: Home / Self Care | Payer: BC Managed Care – PPO | Attending: General Surgery | Admitting: General Surgery

## 2013-07-21 DIAGNOSIS — K56609 Unspecified intestinal obstruction, unspecified as to partial versus complete obstruction: Secondary | ICD-10-CM

## 2013-07-21 DIAGNOSIS — C189 Malignant neoplasm of colon, unspecified: Secondary | ICD-10-CM | POA: Diagnosis present

## 2013-07-21 LAB — BASIC METABOLIC PANEL
BUN: 9 mg/dL (ref 6–23)
CHLORIDE: 104 meq/L (ref 96–112)
CO2: 27 meq/L (ref 19–32)
CREATININE: 0.99 mg/dL (ref 0.50–1.35)
Calcium: 8.9 mg/dL (ref 8.4–10.5)
GFR calc Af Amer: >90 mL/min (ref >90)
GFR calc non Af Amer: >90 mL/min (ref >90)
Glucose, Bld: 87 mg/dL (ref 70–99)
Potassium: 4 mEq/L (ref 3.7–5.3)
Sodium: 142 mEq/L (ref 137–147)

## 2013-07-21 LAB — CBC
HCT: 37.1 % — ABNORMAL LOW (ref 39.0–52.0)
Hemoglobin: 11.8 g/dL — ABNORMAL LOW (ref 13.0–17.0)
MCH: 24.2 pg — AB (ref 26.0–34.0)
MCHC: 31.8 g/dL (ref 30.0–36.0)
MCV: 76.2 fL — AB (ref 78.0–100.0)
Platelets: 269 10*3/uL (ref 150–400)
RBC: 4.87 MIL/uL (ref 4.22–5.81)
RDW: 13.5 % (ref 11.5–15.5)
WBC: 4 10*3/uL (ref 4.0–10.5)

## 2013-07-21 LAB — PREALBUMIN: PREALBUMIN: 20.8 mg/dL (ref 17.0–34.0)

## 2013-07-21 LAB — CEA: CEA: 1 ng/mL (ref 0.0–5.0)

## 2013-07-21 LAB — GLUCOSE, CAPILLARY: Glucose-Capillary: 90 mg/dL (ref 70–99)

## 2013-07-21 SURGERY — COLECTOMY, LEFT, LAPAROSCOPIC
Anesthesia: General | Laterality: Left

## 2013-07-21 SURGERY — Surgical Case
Anesthesia: *Unknown

## 2013-07-21 MED ORDER — LIDOCAINE HCL (CARDIAC) 20 MG/ML IV SOLN
INTRAVENOUS | Status: AC
  Filled 2013-07-21: qty 5

## 2013-07-21 MED ORDER — ONDANSETRON HCL 4 MG/2ML IJ SOLN
INTRAMUSCULAR | Status: AC
Start: 2013-07-21 — End: 2013-07-21
  Filled 2013-07-21: qty 2

## 2013-07-21 MED ORDER — SUFENTANIL CITRATE 50 MCG/ML IV SOLN
INTRAVENOUS | Status: AC
  Filled 2013-07-21: qty 1

## 2013-07-21 MED ORDER — SODIUM CHLORIDE 0.9 % IJ SOLN
INTRAMUSCULAR | Status: AC
  Filled 2013-07-21: qty 10

## 2013-07-21 MED ORDER — DEXAMETHASONE SODIUM PHOSPHATE 10 MG/ML IJ SOLN
INTRAMUSCULAR | Status: AC
  Filled 2013-07-21: qty 1

## 2013-07-21 MED ORDER — CISATRACURIUM BESYLATE 20 MG/10ML IV SOLN
INTRAVENOUS | Status: AC
  Filled 2013-07-21: qty 10

## 2013-07-21 MED ORDER — MIDAZOLAM HCL 2 MG/2ML IJ SOLN
INTRAMUSCULAR | Status: AC
  Filled 2013-07-21: qty 2

## 2013-07-21 MED ORDER — PROPOFOL 10 MG/ML IV BOLUS
INTRAVENOUS | Status: AC
  Filled 2013-07-21: qty 20

## 2013-07-21 NOTE — Progress Notes (Signed)
Subjective: No nausea, vomiting, pain.  Objective: Vital signs in last 24 hours: Temp:  [97.4 F (36.3 C)-98.6 F (37 C)] 98.6 F (37 C) (03/13 0453) Pulse Rate:  [54-70] 63 (03/13 0453) Resp:  [13-60] 16 (03/13 0453) BP: (113-150)/(8-91) 119/74 mmHg (03/13 0453) SpO2:  [95 %-100 %] 98 % (03/13 0453) Weight change:  Last BM Date: 07/20/13  PE: GEN:  NAD ABD:  Soft  Lab Results: CBC    Component Value Date/Time   WBC 4.0 07/21/2013 0343   WBC 9.2 07/18/2013 1232   RBC 4.87 07/21/2013 0343   RBC 5.6 07/18/2013 1232   HGB 11.8* 07/21/2013 0343   HGB 13.2* 07/18/2013 1232   HCT 37.1* 07/21/2013 0343   HCT 42.2* 07/18/2013 1232   PLT 269 07/21/2013 0343   MCV 76.2* 07/21/2013 0343   MCV 75.7* 07/18/2013 1232   MCH 24.2* 07/21/2013 0343   MCH 23.7* 07/18/2013 1232   MCHC 31.8 07/21/2013 0343   MCHC 31.3* 07/18/2013 1232   RDW 13.5 07/21/2013 0343   LYMPHSABS 0.6* 07/18/2013 1940   MONOABS 0.2 07/18/2013 1940   EOSABS 0.0 07/18/2013 1940   BASOSABS 0.0 07/18/2013 1940   CEA normal range  Colon biopsies:  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 Nicoletta Dress)  Assessment:  1.  Colon cancer with partial obstruction. 2.  Microcytic anemia. 3.  Abnormal CT abdomen, colonic thickening, due to colon tumor per #1 above.  Plan:  1.  Virtual colonography planned to complete examination of the more proximal colon. 2.  Surgical intervention tentatively planned for early next week. 3.  Genetic studies underway for colon tumor. 4.  I advised patient that all first-degree relatives (siblings, children) will need colonoscopy if >/= 20 years old. 5.  Patient will need his next colonoscopy in ~ one year. 6.  Will sign-off; please call with questions; thank you for the consult.   I am happy to see in office (Dr. Paulita Fujita, [facility], [phone]) as needed.   Landry Dyke 07/21/2013, 10:28 AM

## 2013-07-21 NOTE — Progress Notes (Signed)
Contrast was brought up for pt.'s virtual colonoscopy. Pt. Was able to drink entire contents of the contrast and tolerated it well.  Will continue to monitor.

## 2013-07-21 NOTE — Consult Note (Signed)
I have seen and examined the pt and agree with PA-Jenning's H&P note. Had a long discussion with the pt and his wife in regards to the finding and the needed work up We discussed seeing if we can get a Virtual Enterography to eval his proximal colon given that is he young and has a family h/o colon CA Pt will require likely L colectomy this hospital stay secondary to narrowing of mass All questions were answered

## 2013-07-21 NOTE — Plan of Care (Signed)
Problem: Phase II Progression Outcomes Goal: Progress activity as tolerated unless otherwise ordered Outcome: Completed/Met Date Met:  07/21/13 Ambulated in hallway today 3/13

## 2013-07-21 NOTE — Progress Notes (Signed)
PROGRESS NOTE   Luke Miller XHB:[id number] DOB: 02-Sep-1965 DOA: 07/18/2013 PCP: Luke Gainer, MD  Brief narrative: Luke Miller is an 48 y.o. male with a PMH of testosterone deficiency and anxiety who presented from his PCP office 07/18/13 with a six-week history of worsening abdominal pain, nausea and nonbloody vomiting in the setting of GI 6 contacts. He has also lost approximately 10 pounds since his illness began. A CT scan done on admission showed thickening of the splenic flexure.  Assessment/Plan: Principal Problem:   Presumed colon cancer with large bowel obstruction CT scan findings consistent with thickening of the splenic flexure with impacted stool. GI consultation done 07/19/13. A near completely obstructing mass was found in the mid transverse colon, biopsies obtained and are positive for adenocarcinoma. Surgical consultation urgently requested with possible surgery planned for 07/24/13. CEA not elevated.  Will request oncology consultation.  Stop empiric Cipro/Flagyl. Active Problems:   Low TSH Free T4 within normal limits.   Testosterone deficiency Continue testosterone hormone replacement every 2 weeks.   Gen. anxiety disorder Continue Xanax each bedtime.   DVT Prophylaxis Continue SCDs.  Code Status: Full. Family Communication: Wife updated at the bedside 07/20/13. Disposition Plan: Home when stable.   IV access:  Peripheral IV  Medical Consultants:  Dr. Arta Silence, Gastroenterology  Dr. Ralene Ok, Surgery  Oncology  Other Consultants:  None  Anti-infectives:  Cipro 07/18/13--->  Flagyl 07/18/13--->  HPI/Subjective: Luke Miller is a bit anxious about his diagnosis. No current complaints of pain, nausea or vomiting. Feels hungry.  Objective: Filed Vitals:   07/20/13 1546 07/20/13 1548 07/20/13 2139 07/21/13 0453  BP: 130/70  123/75 119/74  Pulse: 70  67 63  Temp: 97.6 F (36.4 C)  97.4 F (36.3 C) 98.6 F (37 C)  TempSrc: Oral   Oral Oral  Resp: 16 16 16 16   Height:      Weight:      SpO2: 98% 98% 99% 98%    Intake/Output Summary (Last 24 hours) at 07/21/13 0814 Last data filed at 07/21/13 0300  Gross per 24 hour  Intake    480 ml  Output      0 ml  Net    480 ml    Exam: Gen:  NAD Cardiovascular:  RRR, No M/R/G Respiratory:  Lungs CTAB Gastrointestinal:  Abdomen soft, NT/ND, + BS Extremities:  No C/E/C  Data Reviewed: Basic Metabolic Panel:  Recent Labs Lab 07/18/13 1940 07/19/13 0338 07/21/13 0343  NA 138 139 142  K 4.5 3.7 4.0  CL 99 101 104  CO2 27 24 27   GLUCOSE 142* 111* 87  BUN 10 10 9   CREATININE 0.89 0.83 0.99  CALCIUM 9.6 8.8 8.9  MG 2.2  --   --   PHOS 2.7  --   --    GFR Estimated Creatinine Clearance: 108.2 ml/min (by C-G formula based on Cr of 0.99). Liver Function Tests:  Recent Labs Lab 07/18/13 1940 07/19/13 0338  AST 10 8  ALT 7 6  ALKPHOS 76 66  BILITOT 0.3 0.3  PROT 7.7 6.8  ALBUMIN 3.3* 2.9*   Coagulation profile  Recent Labs Lab 07/18/13 1940 07/20/13 1652  INR 1.09 1.24    CBC:  Recent Labs Lab 07/18/13 1940 07/19/13 0338 07/21/13 0343  WBC 8.7 7.3 4.0  NEUTROABS 7.9*  --   --   HGB 13.0 12.2* 11.8*  HCT 40.4 37.9* 37.1*  MCV 75.1* 75.6* 76.2*  PLT 351 327 269  CBG:  Recent Labs Lab 07/19/13 0800 07/20/13 0803 07/21/13 0800  GLUCAP 115* 99 90   Thyroid function studies  Recent Labs  07/18/13 1940  TSH 0.267*   Microbiology Recent Results (from the past 240 hour(s))  MRSA PCR SCREENING     Status: None   Collection Time    07/20/13  5:00 PM      Result Value Ref Range Status   MRSA by PCR NEGATIVE  NEGATIVE Final   Comment:            The GeneXpert MRSA Assay (FDA     approved for NASAL specimens     only), is one component of a     comprehensive MRSA colonization     surveillance program. It is not     intended to diagnose MRSA     infection nor to guide or     monitor treatment for     MRSA infections.      Procedures and Diagnostic Studies: Ct Abdomen Pelvis Wo Contrast  07/18/2013   CLINICAL DATA Abdominal pain and cramping, weight loss  EXAM CT ABDOMEN AND PELVIS WITHOUT CONTRAST  TECHNIQUE Multidetector CT imaging of the abdomen and pelvis was performed following the standard protocol without IV contrast.  COMPARISON None.  FINDINGS Lung bases clear. Normal heart size. No pericardial or pleural effusion.  Abdomen: Kidneys demonstrate no acute obstruction, hydronephrosis, or obstructing ureteral calculus on either side.  Liver, gallbladder, biliary system, pancreas, spleen, and adrenal glands are within normal limits for age and noncontrast imaging.  No abdominal free fluid, fluid collection, hemorrhage, or abscess.  In the left upper quadrant, the splenic flexure of the colon demonstrates an approximately 11 cm segment of abnormal bowel wall thickening with surrounding strandy pericolonic edema. Small adjacent mesenteric lymph nodes noted, measuring 5 mm or less in size. Appearance is nonspecific by compatible with a short segment of acute colitis versus a colonic malignancy. Proximal to this, there is a large amount of retained stool within the cecum, right colon, and transverse colon. Transverse colon is also moderately distended with air. This appears to represent some degree of partial colonic obstruction. Small bowel is not dilated.  Pelvis: No pelvic free fluid, fluid collection, hemorrhage, abscess, adenopathy, inguinal abnormality, or hernia. Urinary bladder collapsed. Minor distal colon diverticulosis. Distal colon is collapsed. Prostate gland is mildly enlarged.  Degenerative changes of the spine. Previous T12 corpectomy with fusion. No definite acute osseous finding.  IMPRESSION Abnormal splenic flexure of the colon in the left upper quadrant with wall thickening, surrounding pericolonic strandy edema and prominent adjacent nonenlarged lymph nodes compatible with either acute colitis versus a  colonic malignancy.  Proximal colon is distended with retained formed stool and air, compatible with some degree of partial colonic obstruction.  No free air or abscess  These results were called by telephone at the time of interpretation on 07/18/2013 at 2:34 PM to Dr. Redge Miller , who verbally acknowledged these results.  SIGNATURE  Electronically Signed   By: Daryll Brod M.D.   On: 07/18/2013 14:36   Dg Chest 2 View  07/18/2013   CLINICAL DATA Nausea and weight loss.  EXAM CHEST - 2 VIEW  COMPARISON None  FINDINGS The heart size and mediastinal contours are within normal limits. There is no evidence of pulmonary edema, consolidation, pneumothorax, nodule or pleural fluid. There is evidence of prior spinal fusion and corpectomy in the lower thoracic spine/upper lumbar spine.  IMPRESSION No active disease.  SIGNATURE  Electronically Signed   By: Aletta Edouard M.D.   On: 07/18/2013 13:33   Dg Abd 1 View  07/18/2013   CLINICAL DATA:  Nausea and weight loss  EXAM: ABDOMEN - 1 VIEW  COMPARISON:  None.  FINDINGS: The upper abdomen is not completely included in these images. The bowel gas pattern is nonobstructive. There are postsurgical changes of fusion spanning T12 through L2.  IMPRESSION: Nonobstructive bowel gas pattern. The upper abdomen is not completely included in the images.   Electronically Signed   By: Curlene Dolphin M.D.   On: 07/18/2013 11:58    Scheduled Meds: . ALPRAZolam  1.5 mg Oral QHS  . ciprofloxacin  400 mg Intravenous Q12H  . feeding supplement (RESOURCE BREEZE)  1 Container Oral BID BM  . metronidazole  500 mg Intravenous Q8H  . nicotine  21 mg Transdermal Daily  . polyethylene glycol-electrolytes  2,000 mL Oral Once  . [START ON 07/31/2013] testosterone cypionate  200 mg Intramuscular Q14 Days   Continuous Infusions: . sodium chloride 20 mL/hr at 07/20/13 1600    Time spent: 25 minutes.   LOS: 3 days   [facility] Pager [phone]. If unable to  reach me by pager, please call my cell phone at [phone].  *Please note that the hospitalists switch teams on Wednesdays. Please call the flow manager at [phone] if you are having difficulty reaching the hospitalist taking care of this patient as she can update you and provide the most up-to-date pager number of provider caring for the patient. If 8PM-8AM, please contact night-coverage at [email or web address], password [facility]  07/21/2013, 8:14 AM    **Disclaimer: This note was dictated with voice recognition software. Similar sounding words can inadvertently be transcribed and this note may contain transcription errors which may not have been corrected upon publication of note.**

## 2013-07-21 NOTE — Consult Note (Signed)
WOC wound consult note Reason for Consult: Pre operative marking for Ileostomy or possible transverse Colostomy. Patient and wife in room.  Discussed Ileostomy vs. Colostomy.  Patient has had recent, rapid weight loss and has a deep skin fold near the umbilicus.  Observed patient sitting, standing and lying.  Patient wears pants low and belt line is at the hip. Identified the rectus muscle and marked recommended stoma sites 4 cm left and 4 cm down for Ileostomy and 4 cm right and 4 cm down for colostomy. Educational booklet is given to patient, as well as emotional support that active lifestyle can still be enjoyed.  Discussed the stoma appearance and what to expect after surgery.  Both patient and spouse verbalized understanding. [place] nursing team will continue to follow.   Domenic Moras RN BSN [place] Pager [phone]

## 2013-07-21 NOTE — Progress Notes (Signed)
Pt. Transported to [facility] via [place].  Pt. Will return after procedure is complete.

## 2013-07-21 NOTE — Progress Notes (Signed)
Pt. Is scheduled for a virtual colonoscopy at [facility].  [facility] transportation was arranged.

## 2013-07-22 ENCOUNTER — Encounter (HOSPITAL_COMMUNITY): Payer: Self-pay | Admitting: *Deleted

## 2013-07-22 LAB — GLUCOSE, CAPILLARY: Glucose-Capillary: 85 mg/dL (ref 70–99)

## 2013-07-22 MED ORDER — NEOMYCIN SULFATE 500 MG PO TABS
1000.0000 mg | ORAL_TABLET | Freq: Three times a day (TID) | ORAL | Status: AC
  Administered 2013-07-23 (×3): 1000 mg via ORAL
  Filled 2013-07-22 (×3): qty 2

## 2013-07-22 MED ORDER — ERYTHROMYCIN BASE 250 MG PO TABS
1000.0000 mg | ORAL_TABLET | Freq: Three times a day (TID) | ORAL | Status: AC
  Administered 2013-07-23 (×3): 1000 mg via ORAL
  Filled 2013-07-22 (×3): qty 4

## 2013-07-22 NOTE — Progress Notes (Signed)
Patient ID: Luke Miller, male   DOB: [birth year], 48 y.o.   MRN: [id number]  [facility] - Progress Note  Subjective: Patient in bed, comfortable, no pain, no nausea.  Taking clear liquid diet.  Objective: Vital signs in last 24 hours: Temp:  [97.6 F (36.4 C)-98.6 F (37 C)] 97.6 F (36.4 C) (03/14 0521) Pulse Rate:  [60-65] 65 (03/14 0521) Resp:  [16-18] 18 (03/14 0521) BP: (119-122)/(78-81) 122/78 mmHg (03/14 0521) SpO2:  [100 %] 100 % (03/14 0521) Last BM Date: 07/21/13  Intake/Output from previous day: 03/13 0701 - 03/14 0700 In: 1761 [P.O.:1060; I.V.:701] Out: Jun[place] [Urine:1475]  Exam: HEENT - clear, not icteric Neck - soft Chest - clear bilaterally Cor - RRR, no murmur Abd - soft, scaphoid; non-tender Ext - no significant edema Neuro - grossly intact, no focal deficits  Lab Results:   Recent Labs  07/21/13 0343  WBC 4.0  HGB 11.8*  HCT 37.1*  PLT 269     Recent Labs  07/21/13 0343  NA 142  K 4.0  CL 104  CO2 27  GLUCOSE 87  BUN 9  CREATININE 0.99  CALCIUM 8.9    Studies/Results: Ct Virtual Colonoscopy Diagnostic  07/21/2013   CLINICAL DATA:  Incomplete colonoscopy performed yesterday due to obstructing mass of the splenic flexure.  EXAM: CT VIRTUAL COLONOSCOPY DIAGNOSTIC  TECHNIQUE: The patient was not prepped specifically for this procedure. The patient was given oral Gastrografin just prior to the procedure. The quality of the bowel preparation is moderate. Automated CO2 insufflation of the colon was performed prior to image acquisition and colonic distention is moderate. Image post processing was used to generate a 3D endoluminal fly-through projection of the colon and to electronically subtract stool/fluid as appropriate.  COMPARISON:  CT 07/18/2013.  Colonoscopy report from 07/20/2013.  FINDINGS: There is a large apple core type lesion at the splenic flexure corresponding to the mass seen on prior CT and  colonoscopy. Markedly irregular wall thickening in this area. The more proximal colon was insufflated. There is layering Gastrografin contrast within the right colon and transverse colon. No persistent Large masses or annular constricting lesions noted within the right colon or transverse colon.  There was an area of bulbous haustral thickening in the sigmoid colon approximately 22-25 cm from the anal verge. This would have been visible by optical colonoscopy. Recommend clinical correlation.  IMPRESSION: Large annular constricting apple-core lesion at the splenic flexure as seen on prior CT and colonoscopy. Findings most compatible with colon cancer. No additional Large or annular constricting lesion within the proximal colon.  Bulbous thickening of a haustra within the sigmoid colon. Recommend correlation with recent colonoscopy. This could represent adherent stool.  Virtual colonoscopy is not designed to detect diminutive polyps (i.e., less than or equal to 5 mm), the presence or absence of which may not affect clinical management.  CT ABDOMEN AND PELVIS WITHOUT CONTRAST  FINDINGS: Liver, spleen, pancreas, adrenals and kidneys have an unremarkable unenhanced appearance. Small bowel is decompressed. No free fluid, free air or adenopathy. Urinary bladder is unremarkable. Aorta is normal caliber.  Lung bases are clear.  IMPRESSION: No additional abnormality seen in the abdomen or pelvis.  These results were called by telephone at the time of interpretation on 07/21/2013 at 1:57 PM to Dr. Ralene Ok , who verbally acknowledged these results.   Electronically Signed   By: Rolm Baptise M.D.   On: 07/21/2013 13:58    Assessment / Plan:  1.  Colonic neoplasm at splenic flexure with near obstruction  Virtual colonoscopy reviewed and discussed with Dr. Zella Richer  Continue clear liquid diet  Orders for OR on Monday 3/16 entered  Earnstine Regal, MD, [facility] Office:  [phone]  07/22/2013

## 2013-07-22 NOTE — Progress Notes (Signed)
PROGRESS NOTE   Luke Miller HKV:[id number] DOB: 1966/03/22 DOA: 07/18/2013 PCP: [name], MD  Brief narrative: Luke Miller is an 48 y.o. male with a PMH of testosterone deficiency and anxiety who presented from his PCP office 07/18/13 with a six-week history of worsening abdominal pain, nausea and nonbloody vomiting in the setting of GI 6 contacts. He has also lost approximately 10 pounds since his illness began. A CT scan done on admission showed thickening of the splenic flexure.  Assessment/Plan: Principal Problem:   Presumed colon cancer with large bowel obstruction CT scan findings consistent with thickening of the splenic flexure with impacted stool. GI consultation done 07/19/13. A near completely obstructing mass was found in the mid transverse colon, biopsies obtained and are positive for adenocarcinoma. Surgical consultation urgently requested with possible surgery planned for 07/24/13. Had virtual colonoscopy done 07/21/13. CEA not elevated.  Will need outpatient oncology followup.   Active Problems:   Low TSH Free T4 within normal limits.   Testosterone deficiency Continue testosterone hormone replacement every 2 weeks.   Gen. anxiety disorder Continue Xanax each bedtime.   DVT Prophylaxis Continue SCDs.  Code Status: Full. Family Communication: Wife updated at the bedside 07/20/13. Disposition Plan: Home when stable.   IV access:  Peripheral IV  Medical Consultants:  Dr. Arta Silence, Gastroenterology  Dr. Ralene Ok, Surgery  Other Consultants:  None  Anti-infectives:  Cipro 07/18/13--->  Flagyl 07/18/13--->  HPI/Subjective: Luke Miller is a bit anxious about his diagnosis and need for possible colostomy. No current complaints of pain, nausea or vomiting.   Objective: Filed Vitals:   07/21/13 1340 07/21/13 1918 07/21/13 2255 07/22/13 0521  BP: 119/81  121/79 122/78  Pulse: 60 64 64 65  Temp: 98.6 F (37 C)  98.2 F (36.8 C) 97.6 F  (36.4 C)  TempSrc: Oral  Oral Oral  Resp: 16  16 18   Height:      Weight:      SpO2: 100%  100% 100%    Intake/Output Summary (Last 24 hours) at 07/22/13 0839 Last data filed at 07/22/13 0631  Gross per 24 hour  Intake   1761 ml  Output   1475 ml  Net    286 ml    Exam: Gen:  NAD Cardiovascular:  RRR, No M/R/G Respiratory:  Lungs CTAB Gastrointestinal:  Abdomen soft, NT/ND, + BS Extremities:  No C/E/C  Data Reviewed: Basic Metabolic Panel:  Recent Labs Lab 07/18/13 1940 07/19/13 0338 07/21/13 0343  NA 138 139 142  K 4.5 3.7 4.0  CL 99 101 104  CO2 27 24 27   GLUCOSE 142* 111* 87  BUN 10 10 9   CREATININE 0.89 0.83 0.99  CALCIUM 9.6 8.8 8.9  MG 2.2  --   --   PHOS 2.7  --   --    GFR Estimated Creatinine Clearance: 108.2 ml/min (by C-G formula based on Cr of 0.99). Liver Function Tests:  Recent Labs Lab 07/18/13 1940 07/19/13 0338  AST 10 8  ALT 7 6  ALKPHOS 76 66  BILITOT 0.3 0.3  PROT 7.7 6.8  ALBUMIN 3.3* 2.9*   Coagulation profile  Recent Labs Lab 07/18/13 1940 07/20/13 1652  INR 1.09 1.24    CBC:  Recent Labs Lab 07/18/13 1940 07/19/13 0338 07/21/13 0343  WBC 8.7 7.3 4.0  NEUTROABS 7.9*  --   --   HGB 13.0 12.2* 11.8*  HCT 40.4 37.9* 37.1*  MCV 75.1* 75.6* 76.2*  PLT 351 327 269  CBG:  Recent Labs Lab 07/19/13 0800 07/20/13 0803 07/21/13 0800 07/22/13 0524  GLUCAP 115* 99 90 85   Thyroid function studies No results found for this basename: TSH, T4TOTAL, FREET3, T3FREE, THYROIDAB,  in the last 72 hours Microbiology Recent Results (from the past 240 hour(s))  MRSA PCR SCREENING     Status: None   Collection Time    07/20/13  5:00 PM      Result Value Ref Range Status   MRSA by PCR NEGATIVE  NEGATIVE Final   Comment:            The GeneXpert MRSA Assay (FDA     approved for NASAL specimens     only), is one component of a     comprehensive MRSA colonization     surveillance program. It is not     intended to  diagnose MRSA     infection nor to guide or     monitor treatment for     MRSA infections.     Procedures and Diagnostic Studies: Ct Abdomen Pelvis Wo Contrast  07/18/2013   CLINICAL DATA Abdominal pain and cramping, weight loss  EXAM CT ABDOMEN AND PELVIS WITHOUT CONTRAST  TECHNIQUE Multidetector CT imaging of the abdomen and pelvis was performed following the standard protocol without IV contrast.  COMPARISON None.  FINDINGS Lung bases clear. Normal heart size. No pericardial or pleural effusion.  Abdomen: Kidneys demonstrate no acute obstruction, hydronephrosis, or obstructing ureteral calculus on either side.  Liver, gallbladder, biliary system, pancreas, spleen, and adrenal glands are within normal limits for age and noncontrast imaging.  No abdominal free fluid, fluid collection, hemorrhage, or abscess.  In the left upper quadrant, the splenic flexure of the colon demonstrates an approximately 11 cm segment of abnormal bowel wall thickening with surrounding strandy pericolonic edema. Small adjacent mesenteric lymph nodes noted, measuring 5 mm or less in size. Appearance is nonspecific by compatible with a short segment of acute colitis versus a colonic malignancy. Proximal to this, there is a large amount of retained stool within the cecum, right colon, and transverse colon. Transverse colon is also moderately distended with air. This appears to represent some degree of partial colonic obstruction. Small bowel is not dilated.  Pelvis: No pelvic free fluid, fluid collection, hemorrhage, abscess, adenopathy, inguinal abnormality, or hernia. Urinary bladder collapsed. Minor distal colon diverticulosis. Distal colon is collapsed. Prostate gland is mildly enlarged.  Degenerative changes of the spine. Previous T12 corpectomy with fusion. No definite acute osseous finding.  IMPRESSION Abnormal splenic flexure of the colon in the left upper quadrant with wall thickening, surrounding pericolonic strandy edema  and prominent adjacent nonenlarged lymph nodes compatible with either acute colitis versus a colonic malignancy.  Proximal colon is distended with retained formed stool and air, compatible with some degree of partial colonic obstruction.  No free air or abscess  These results were called by telephone at the time of interpretation on 07/18/2013 at 2:34 PM to Dr. [name] , who verbally acknowledged these results.  SIGNATURE  Electronically Signed   By: Daryll Brod M.D.   On: 07/18/2013 14:36   Dg Chest 2 View  07/18/2013   CLINICAL DATA Nausea and weight loss.  EXAM CHEST - 2 VIEW  COMPARISON None  FINDINGS The heart size and mediastinal contours are within normal limits. There is no evidence of pulmonary edema, consolidation, pneumothorax, nodule or pleural fluid. There is evidence of prior spinal fusion and corpectomy in the lower thoracic spine/upper  lumbar spine.  IMPRESSION No active disease.  SIGNATURE  Electronically Signed   By: Aletta Edouard M.D.   On: 07/18/2013 13:33   Dg Abd 1 View  07/18/2013   CLINICAL DATA:  Nausea and weight loss  EXAM: ABDOMEN - 1 VIEW  COMPARISON:  None.  FINDINGS: The upper abdomen is not completely included in these images. The bowel gas pattern is nonobstructive. There are postsurgical changes of fusion spanning T12 through L2.  IMPRESSION: Nonobstructive bowel gas pattern. The upper abdomen is not completely included in the images.   Electronically Signed   By: Curlene Dolphin M.D.   On: 07/18/2013 11:58    Scheduled Meds: . ALPRAZolam  1.5 mg Oral QHS  . feeding supplement (RESOURCE BREEZE)  1 Container Oral BID BM  . nicotine  21 mg Transdermal Daily  . polyethylene glycol-electrolytes  2,000 mL Oral Once  . [START ON 07/31/2013] testosterone cypionate  200 mg Intramuscular Q14 Days   Continuous Infusions: . sodium chloride 20 mL/hr at 07/21/13 1900    Time spent: 25 minutes.   LOS: 4 days   [facility] Pager [phone]. If  unable to reach me by pager, please call my cell phone at [phone].  *Please note that the hospitalists switch teams on Wednesdays. Please call the flow manager at [phone] if you are having difficulty reaching the hospitalist taking care of this patient as she can update you and provide the most up-to-date pager number of provider caring for the patient. If 8PM-8AM, please contact night-coverage at [email or web address], password [facility]  07/22/2013, 8:39 AM    **Disclaimer: This note was dictated with voice recognition software. Similar sounding words can inadvertently be transcribed and this note may contain transcription errors which may not have been corrected upon publication of note.**

## 2013-07-23 LAB — GLUCOSE, CAPILLARY: GLUCOSE-CAPILLARY: 120 mg/dL — AB (ref 70–99)

## 2013-07-23 LAB — SURGICAL PCR SCREEN
MRSA, PCR: NEGATIVE
STAPHYLOCOCCUS AUREUS: NEGATIVE

## 2013-07-23 NOTE — Progress Notes (Signed)
Patient ID: Luke Miller, male   DOB: 10/20/1965, 48 y.o.   MRN: [id number]  [facility]  Pre-op orders written yesterday.  Plan oral abx's today.  Permit on chart.  Dr. Jackolyn Confer will perform procedure tomorrow.  He will see the patient in the morning.  OR scheduled for 10 AM.  No further questions from patient or wife at this time.  Earnstine Regal, MD, [facility] Office: [phone]

## 2013-07-23 NOTE — Progress Notes (Signed)
PROGRESS NOTE   Luke Miller XNA:[id number] DOB: 03-30-1966 DOA: 07/18/2013 PCP: [name], MD  Brief narrative: Luke Miller is an 48 y.o. male with a PMH of testosterone deficiency and anxiety who presented from his PCP office 07/18/13 with a six-week history of worsening abdominal pain, nausea and nonbloody vomiting in the setting of GI 6 contacts. He has also lost approximately 10 pounds since his illness began. A CT scan done on admission showed thickening of the splenic flexure.  Assessment/Plan: Principal Problem:   Presumed colon cancer with large bowel obstruction CT scan findings consistent with thickening of the splenic flexure with impacted stool. GI consultation done 07/19/13. A near completely obstructing mass was found in the mid transverse colon, biopsies obtained and are positive for adenocarcinoma. Surgical consultation urgently requested with surgery planned for 07/24/13. Had virtual colonoscopy done 07/21/13. CEA not elevated.  Will need outpatient oncology followup.   Active Problems:   Low TSH Free T4 within normal limits.   Testosterone deficiency Continue testosterone hormone replacement every 2 weeks.   Gen. anxiety disorder Continue Xanax each bedtime.   DVT Prophylaxis Continue SCDs.  Code Status: Full. Family Communication: Wife updated at the bedside 07/23/13. Disposition Plan: Home when stable.   IV access:  Peripheral IV  Medical Consultants:  Dr. Arta Silence, Gastroenterology  Dr. Ralene Ok, Surgery  Other Consultants:  None  Anti-infectives:  Cipro 07/18/13--->07/24/13  Flagyl 07/18/13--->07/24/13  HPI/Subjective: Luke Miller feels well overall. Occasional abdominal cramping but no persistent pain, no nausea or vomiting.   Objective: Filed Vitals:   07/22/13 0521 07/22/13 1338 07/22/13 2117 07/23/13 0642  BP: 122/78 122/69 134/76 92/72  Pulse: 65 56 67 61  Temp: 97.6 F (36.4 C) 97.4 F (36.3 C) 99 F (37.2 C) 97.8 F  (36.6 C)  TempSrc: Oral Oral Oral Oral  Resp: 18 18 18 18   Height:      Weight:      SpO2: 100% 100% 100% 100%    Intake/Output Summary (Last 24 hours) at 07/23/13 0729 Last data filed at 07/23/13 5427  Gross per 24 hour  Intake   1060 ml  Output    400 ml  Net    660 ml    Exam: Gen:  NAD Cardiovascular:  RRR, No M/R/G Respiratory:  Lungs CTAB Gastrointestinal:  Abdomen soft, NT/ND, + BS Extremities:  No C/E/C  Data Reviewed: Basic Metabolic Panel:  Recent Labs Lab 07/18/13 1940 07/19/13 0338 07/21/13 0343  NA 138 139 142  K 4.5 3.7 4.0  CL 99 101 104  CO2 27 24 27   GLUCOSE 142* 111* 87  BUN 10 10 9   CREATININE 0.89 0.83 0.99  CALCIUM 9.6 8.8 8.9  MG 2.2  --   --   PHOS 2.7  --   --    GFR Estimated Creatinine Clearance: 108.2 ml/min (by C-G formula based on Cr of 0.99). Liver Function Tests:  Recent Labs Lab 07/18/13 1940 07/19/13 0338  AST 10 8  ALT 7 6  ALKPHOS 76 66  BILITOT 0.3 0.3  PROT 7.7 6.8  ALBUMIN 3.3* 2.9*   Coagulation profile  Recent Labs Lab 07/18/13 1940 07/20/13 1652  INR 1.09 1.24    CBC:  Recent Labs Lab 07/18/13 1940 07/19/13 0338 07/21/13 0343  WBC 8.7 7.3 4.0  NEUTROABS 7.9*  --   --   HGB 13.0 12.2* 11.8*  HCT 40.4 37.9* 37.1*  MCV 75.1* 75.6* 76.2*  PLT 351 327 269   CBG:  Recent Labs Lab 07/19/13 0800 07/20/13 0803 07/21/13 0800 07/22/13 0524  GLUCAP 115* 99 90 85   Thyroid function studies No results found for this basename: TSH, T4TOTAL, FREET3, T3FREE, THYROIDAB,  in the last 72 hours Microbiology Recent Results (from the past 240 hour(s))  MRSA PCR SCREENING     Status: None   Collection Time    07/20/13  5:00 PM      Result Value Ref Range Status   MRSA by PCR NEGATIVE  NEGATIVE Final   Comment:            The GeneXpert MRSA Assay (FDA     approved for NASAL specimens     only), is one component of a     comprehensive MRSA colonization     surveillance program. It is not      intended to diagnose MRSA     infection nor to guide or     monitor treatment for     MRSA infections.     Procedures and Diagnostic Studies: Ct Abdomen Pelvis Wo Contrast  07/18/2013   CLINICAL DATA Abdominal pain and cramping, weight loss  EXAM CT ABDOMEN AND PELVIS WITHOUT CONTRAST  TECHNIQUE Multidetector CT imaging of the abdomen and pelvis was performed following the standard protocol without IV contrast.  COMPARISON None.  FINDINGS Lung bases clear. Normal heart size. No pericardial or pleural effusion.  Abdomen: Kidneys demonstrate no acute obstruction, hydronephrosis, or obstructing ureteral calculus on either side.  Liver, gallbladder, biliary system, pancreas, spleen, and adrenal glands are within normal limits for age and noncontrast imaging.  No abdominal free fluid, fluid collection, hemorrhage, or abscess.  In the left upper quadrant, the splenic flexure of the colon demonstrates an approximately 11 cm segment of abnormal bowel wall thickening with surrounding strandy pericolonic edema. Small adjacent mesenteric lymph nodes noted, measuring 5 mm or less in size. Appearance is nonspecific by compatible with a short segment of acute colitis versus a colonic malignancy. Proximal to this, there is a large amount of retained stool within the cecum, right colon, and transverse colon. Transverse colon is also moderately distended with air. This appears to represent some degree of partial colonic obstruction. Small bowel is not dilated.  Pelvis: No pelvic free fluid, fluid collection, hemorrhage, abscess, adenopathy, inguinal abnormality, or hernia. Urinary bladder collapsed. Minor distal colon diverticulosis. Distal colon is collapsed. Prostate gland is mildly enlarged.  Degenerative changes of the spine. Previous T12 corpectomy with fusion. No definite acute osseous finding.  IMPRESSION Abnormal splenic flexure of the colon in the left upper quadrant with wall thickening, surrounding pericolonic  strandy edema and prominent adjacent nonenlarged lymph nodes compatible with either acute colitis versus a colonic malignancy.  Proximal colon is distended with retained formed stool and air, compatible with some degree of partial colonic obstruction.  No free air or abscess  These results were called by telephone at the time of interpretation on 07/18/2013 at 2:34 PM to Dr. [name] , who verbally acknowledged these results.  SIGNATURE  Electronically Signed   By: Daryll Brod M.D.   On: 07/18/2013 14:36   Dg Chest 2 View  07/18/2013   CLINICAL DATA Nausea and weight loss.  EXAM CHEST - 2 VIEW  COMPARISON None  FINDINGS The heart size and mediastinal contours are within normal limits. There is no evidence of pulmonary edema, consolidation, pneumothorax, nodule or pleural fluid. There is evidence of prior spinal fusion and corpectomy in the lower thoracic spine/upper lumbar spine.  IMPRESSION No active disease.  SIGNATURE  Electronically Signed   By: Aletta Edouard M.D.   On: 07/18/2013 13:33   Dg Abd 1 View  07/18/2013   CLINICAL DATA:  Nausea and weight loss  EXAM: ABDOMEN - 1 VIEW  COMPARISON:  None.  FINDINGS: The upper abdomen is not completely included in these images. The bowel gas pattern is nonobstructive. There are postsurgical changes of fusion spanning T12 through L2.  IMPRESSION: Nonobstructive bowel gas pattern. The upper abdomen is not completely included in the images.   Electronically Signed   By: Curlene Dolphin M.D.   On: 07/18/2013 11:58    Scheduled Meds: . ALPRAZolam  1.5 mg Oral QHS  . erythromycin  1,000 mg Oral TID BM  . feeding supplement (RESOURCE BREEZE)  1 Container Oral BID BM  . neomycin  1,000 mg Oral TID BM  . nicotine  21 mg Transdermal Daily  . polyethylene glycol-electrolytes  2,000 mL Oral Once  . [START ON 07/31/2013] testosterone cypionate  200 mg Intramuscular Q14 Days   Continuous Infusions: . sodium chloride 20 mL/hr at 07/22/13 0700    Time spent: 25  minutes.   LOS: 5 days   [facility] Pager [phone]. If unable to reach me by pager, please call my cell phone at [phone].  *Please note that the hospitalists switch teams on Wednesdays. Please call the flow manager at [phone] if you are having difficulty reaching the hospitalist taking care of this patient as she can update you and provide the most up-to-date pager number of provider caring for the patient. If 8PM-8AM, please contact night-coverage at [email or web address], password [facility]  07/23/2013, 7:29 AM    **Disclaimer: This note was dictated with voice recognition software. Similar sounding words can inadvertently be transcribed and this note may contain transcription errors which may not have been corrected upon publication of note.**

## 2013-07-24 ENCOUNTER — Encounter (HOSPITAL_COMMUNITY): Payer: BC Managed Care – PPO | Admitting: Anesthesiology

## 2013-07-24 ENCOUNTER — Encounter (HOSPITAL_COMMUNITY)
Admission: AD | Disposition: A | Discharge status: Home / Self Care | Payer: Self-pay | Source: Ambulatory Visit | Attending: Internal Medicine

## 2013-07-24 ENCOUNTER — Encounter (HOSPITAL_COMMUNITY): Payer: Self-pay | Admitting: Anesthesiology

## 2013-07-24 ENCOUNTER — Inpatient Hospital Stay (HOSPITAL_COMMUNITY): Payer: BC Managed Care – PPO | Admitting: Anesthesiology

## 2013-07-24 DIAGNOSIS — C189 Malignant neoplasm of colon, unspecified: Secondary | ICD-10-CM

## 2013-07-24 HISTORY — PX: LAPAROSCOPIC PARTIAL COLECTOMY: SHX5907

## 2013-07-24 LAB — TYPE AND SCREEN
ABO/RH(D): A POS
Antibody Screen: NEGATIVE

## 2013-07-24 LAB — GLUCOSE, CAPILLARY: Glucose-Capillary: 88 mg/dL (ref 70–99)

## 2013-07-24 SURGERY — LAPAROSCOPIC PARTIAL COLECTOMY
Anesthesia: General | Site: Abdomen

## 2013-07-24 MED ORDER — PROPOFOL 10 MG/ML IV BOLUS
INTRAVENOUS | Status: AC
  Filled 2013-07-24: qty 20

## 2013-07-24 MED ORDER — ROCURONIUM BROMIDE 100 MG/10ML IV SOLN
INTRAVENOUS | Status: AC
  Filled 2013-07-24: qty 1

## 2013-07-24 MED ORDER — FENTANYL CITRATE 0.05 MG/ML IJ SOLN
INTRAMUSCULAR | Status: AC
  Filled 2013-07-24: qty 5

## 2013-07-24 MED ORDER — SUCCINYLCHOLINE CHLORIDE 20 MG/ML IJ SOLN
INTRAMUSCULAR | Status: DC | PRN
  Administered 2013-07-24: 140 mg via INTRAVENOUS

## 2013-07-24 MED ORDER — FENTANYL CITRATE 0.05 MG/ML IJ SOLN
INTRAMUSCULAR | Status: AC
  Filled 2013-07-24: qty 2

## 2013-07-24 MED ORDER — 0.9 % SODIUM CHLORIDE (POUR BTL) OPTIME
TOPICAL | Status: DC | PRN
  Administered 2013-07-24: 6000 mL

## 2013-07-24 MED ORDER — PHENYLEPHRINE HCL 10 MG/ML IJ SOLN
INTRAMUSCULAR | Status: DC | PRN
  Administered 2013-07-24: 60 ug via INTRAVENOUS
  Administered 2013-07-24: 20 ug via INTRAVENOUS
  Administered 2013-07-24 (×2): 60 ug via INTRAVENOUS
  Administered 2013-07-24: 40 ug via INTRAVENOUS
  Administered 2013-07-24: 60 ug via INTRAVENOUS

## 2013-07-24 MED ORDER — PHENYLEPHRINE 40 MCG/ML (10ML) SYRINGE FOR IV PUSH (FOR BLOOD PRESSURE SUPPORT)
PREFILLED_SYRINGE | INTRAVENOUS | Status: AC
  Filled 2013-07-24: qty 10

## 2013-07-24 MED ORDER — MIDAZOLAM HCL 5 MG/5ML IJ SOLN
INTRAMUSCULAR | Status: DC | PRN
  Administered 2013-07-24: 2 mg via INTRAVENOUS

## 2013-07-24 MED ORDER — MORPHINE SULFATE (PF) 1 MG/ML IV SOLN
INTRAVENOUS | Status: DC
  Administered 2013-07-24: 26.84 mg via INTRAVENOUS
  Administered 2013-07-24 (×2): via INTRAVENOUS
  Administered 2013-07-24: 10.2 mg via INTRAVENOUS
  Administered 2013-07-24: 1 mg via INTRAVENOUS
  Administered 2013-07-25 (×3): via INTRAVENOUS
  Administered 2013-07-25: 26.79 mg via INTRAVENOUS
  Administered 2013-07-25: 10.64 mg via INTRAVENOUS
  Administered 2013-07-25: 38.04 mg via INTRAVENOUS
  Administered 2013-07-25: 20.1 mg via INTRAVENOUS
  Administered 2013-07-26: 17 mg via INTRAVENOUS
  Administered 2013-07-26: 05:00:00 via INTRAVENOUS
  Administered 2013-07-26: 20.43 mg via INTRAVENOUS
  Filled 2013-07-24 (×8): qty 25

## 2013-07-24 MED ORDER — LACTATED RINGERS IR SOLN
Status: DC | PRN
  Administered 2013-07-24: 1000 mL

## 2013-07-24 MED ORDER — HEPARIN SODIUM (PORCINE) 5000 UNIT/ML IJ SOLN
5000.0000 [IU] | Freq: Three times a day (TID) | INTRAMUSCULAR | Status: DC
  Administered 2013-07-25 – 2013-07-29 (×11): 5000 [IU] via SUBCUTANEOUS
  Filled 2013-07-24 (×15): qty 1

## 2013-07-24 MED ORDER — BUPIVACAINE HCL (PF) 0.5 % IJ SOLN
INTRAMUSCULAR | Status: DC | PRN
  Administered 2013-07-24: 10 mL

## 2013-07-24 MED ORDER — FENTANYL CITRATE 0.05 MG/ML IJ SOLN
25.0000 ug | INTRAMUSCULAR | Status: DC | PRN
  Administered 2013-07-24 (×5): 25 ug via INTRAVENOUS

## 2013-07-24 MED ORDER — PHENYLEPHRINE HCL 10 MG/ML IJ SOLN
INTRAMUSCULAR | Status: AC
  Filled 2013-07-24: qty 1

## 2013-07-24 MED ORDER — LACTATED RINGERS IV SOLN
INTRAVENOUS | Status: DC | PRN
  Administered 2013-07-24 (×4): via INTRAVENOUS

## 2013-07-24 MED ORDER — DEXTROSE 5 % IV SOLN
10.0000 mg | INTRAVENOUS | Status: DC | PRN
  Administered 2013-07-24: 20 ug/min via INTRAVENOUS

## 2013-07-24 MED ORDER — HYDROMORPHONE HCL PF 1 MG/ML IJ SOLN
INTRAMUSCULAR | Status: DC | PRN
  Administered 2013-07-24: 0.5 mg via INTRAVENOUS
  Administered 2013-07-24 (×2): 1 mg via INTRAVENOUS
  Administered 2013-07-24: 0.5 mg via INTRAVENOUS

## 2013-07-24 MED ORDER — LABETALOL HCL 5 MG/ML IV SOLN
INTRAVENOUS | Status: DC | PRN
  Administered 2013-07-24 (×2): 5 mg via INTRAVENOUS

## 2013-07-24 MED ORDER — BUPIVACAINE HCL (PF) 0.5 % IJ SOLN
INTRAMUSCULAR | Status: AC
  Filled 2013-07-24: qty 30

## 2013-07-24 MED ORDER — PANTOPRAZOLE SODIUM 40 MG IV SOLR
40.0000 mg | INTRAVENOUS | Status: DC
  Administered 2013-07-24: 40 mg via INTRAVENOUS
  Filled 2013-07-24 (×2): qty 40

## 2013-07-24 MED ORDER — DIPHENHYDRAMINE HCL 50 MG/ML IJ SOLN
12.5000 mg | Freq: Four times a day (QID) | INTRAMUSCULAR | Status: DC | PRN
  Administered 2013-07-24: 12.5 mg via INTRAVENOUS
  Filled 2013-07-24: qty 1

## 2013-07-24 MED ORDER — MORPHINE SULFATE (PF) 1 MG/ML IV SOLN
INTRAVENOUS | Status: AC
  Filled 2013-07-24: qty 25

## 2013-07-24 MED ORDER — DIPHENHYDRAMINE HCL 12.5 MG/5ML PO ELIX
12.5000 mg | ORAL_SOLUTION | Freq: Four times a day (QID) | ORAL | Status: DC | PRN
  Filled 2013-07-24: qty 5

## 2013-07-24 MED ORDER — ONDANSETRON HCL 4 MG/2ML IJ SOLN
4.0000 mg | INTRAMUSCULAR | Status: DC | PRN
  Administered 2013-07-26: 4 mg via INTRAVENOUS
  Filled 2013-07-24 (×2): qty 2

## 2013-07-24 MED ORDER — ONDANSETRON HCL 4 MG PO TABS
4.0000 mg | ORAL_TABLET | Freq: Four times a day (QID) | ORAL | Status: DC | PRN
  Administered 2013-07-27: 4 mg via ORAL
  Filled 2013-07-24: qty 1

## 2013-07-24 MED ORDER — ONDANSETRON HCL 4 MG/2ML IJ SOLN
INTRAMUSCULAR | Status: DC | PRN
  Administered 2013-07-24: 4 mg via INTRAVENOUS

## 2013-07-24 MED ORDER — KCL-LACTATED RINGERS-D5W 20 MEQ/L IV SOLN
INTRAVENOUS | Status: DC
  Administered 2013-07-24 – 2013-07-26 (×5): via INTRAVENOUS
  Filled 2013-07-24 (×11): qty 1000

## 2013-07-24 MED ORDER — NALOXONE HCL 0.4 MG/ML IJ SOLN: 0.4000 mg | INTRAMUSCULAR | Status: DC | PRN

## 2013-07-24 MED ORDER — MIDAZOLAM HCL 2 MG/2ML IJ SOLN
INTRAMUSCULAR | Status: AC
  Filled 2013-07-24: qty 2

## 2013-07-24 MED ORDER — ROCURONIUM BROMIDE 100 MG/10ML IV SOLN
INTRAVENOUS | Status: DC | PRN
  Administered 2013-07-24 (×3): 10 mg via INTRAVENOUS
  Administered 2013-07-24: 40 mg via INTRAVENOUS
  Administered 2013-07-24: 20 mg via INTRAVENOUS
  Administered 2013-07-24: 10 mg via INTRAVENOUS
  Administered 2013-07-24 (×2): 20 mg via INTRAVENOUS
  Administered 2013-07-24 (×2): 10 mg via INTRAVENOUS

## 2013-07-24 MED ORDER — SODIUM CHLORIDE 0.9 % IJ SOLN: 9.0000 mL | INTRAMUSCULAR | Status: DC | PRN

## 2013-07-24 MED ORDER — LACTATED RINGERS IV SOLN: INTRAVENOUS | Status: DC

## 2013-07-24 MED ORDER — HYDROMORPHONE HCL PF 2 MG/ML IJ SOLN
INTRAMUSCULAR | Status: AC
  Filled 2013-07-24: qty 1

## 2013-07-24 MED ORDER — PROPOFOL 10 MG/ML IV BOLUS
INTRAVENOUS | Status: DC | PRN
  Administered 2013-07-24: 200 mg via INTRAVENOUS

## 2013-07-24 MED ORDER — NEOSTIGMINE METHYLSULFATE 1 MG/ML IJ SOLN
INTRAMUSCULAR | Status: DC | PRN
  Administered 2013-07-24: 4 mg via INTRAVENOUS

## 2013-07-24 MED ORDER — LIDOCAINE HCL (CARDIAC) 20 MG/ML IV SOLN
INTRAVENOUS | Status: DC | PRN
  Administered 2013-07-24: 80 mg via INTRAVENOUS

## 2013-07-24 MED ORDER — ONDANSETRON HCL 4 MG/2ML IJ SOLN
INTRAMUSCULAR | Status: AC
  Filled 2013-07-24: qty 2

## 2013-07-24 MED ORDER — LABETALOL HCL 5 MG/ML IV SOLN
INTRAVENOUS | Status: AC
  Filled 2013-07-24: qty 4

## 2013-07-24 MED ORDER — CEFOTETAN DISODIUM 2 G IJ SOLR
2.0000 g | Freq: Two times a day (BID) | INTRAMUSCULAR | Status: AC
  Administered 2013-07-24: 2 g via INTRAVENOUS
  Filled 2013-07-24: qty 2

## 2013-07-24 MED ORDER — ONDANSETRON HCL 4 MG/2ML IJ SOLN: 4.0000 mg | Freq: Four times a day (QID) | INTRAMUSCULAR | Status: DC | PRN

## 2013-07-24 MED ORDER — LIDOCAINE HCL (CARDIAC) 20 MG/ML IV SOLN
INTRAVENOUS | Status: AC
  Filled 2013-07-24: qty 5

## 2013-07-24 MED ORDER — FENTANYL CITRATE 0.05 MG/ML IJ SOLN
INTRAMUSCULAR | Status: DC | PRN
  Administered 2013-07-24 (×2): 100 ug via INTRAVENOUS
  Administered 2013-07-24 (×2): 50 ug via INTRAVENOUS
  Administered 2013-07-24 (×2): 100 ug via INTRAVENOUS

## 2013-07-24 MED ORDER — DEXTROSE 5 % IV SOLN
1.0000 g | Freq: Once | INTRAVENOUS | Status: AC
  Administered 2013-07-24: 1 g via INTRAVENOUS
  Filled 2013-07-24: qty 1

## 2013-07-24 MED ORDER — GLYCOPYRROLATE 0.2 MG/ML IJ SOLN
INTRAMUSCULAR | Status: DC | PRN
  Administered 2013-07-24: 0.6 mg via INTRAVENOUS

## 2013-07-24 SURGICAL SUPPLY — 84 items
APPLIER CLIP 5 13 M/L LIGAMAX5 (MISCELLANEOUS)
APPLIER CLIP ROT 10 11.4 M/L (STAPLE)
BLADE EXTENDED COATED 6.5IN (ELECTRODE) ×3 IMPLANT
BLADE HEX COATED 2.75 (ELECTRODE) ×6 IMPLANT
BLADE SURG SZ10 CARB STEEL (BLADE) ×3 IMPLANT
CABLE HIGH FREQUENCY MONO STRZ (ELECTRODE) ×3 IMPLANT
CANISTER SUCTION 2500CC (MISCELLANEOUS) IMPLANT
CELLS DAT CNTRL 66122 CELL SVR (MISCELLANEOUS) IMPLANT
CLIP APPLIE 5 13 M/L LIGAMAX5 (MISCELLANEOUS) IMPLANT
CLIP APPLIE ROT 10 11.4 M/L (STAPLE) IMPLANT
COUNTER NEEDLE 20 DBL MAG RED (NEEDLE) IMPLANT
COVER MAYO STAND STRL (DRAPES) ×6 IMPLANT
DECANTER SPIKE VIAL GLASS SM (MISCELLANEOUS) IMPLANT
DEVICE SUTURE ENDOST 10MM (ENDOMECHANICALS) ×3 IMPLANT
DISSECTOR BLUNT TIP ENDO 5MM (MISCELLANEOUS) ×3 IMPLANT
DRAIN CHANNEL 19F RND (DRAIN) ×3 IMPLANT
DRAPE LAPAROSCOPIC ABDOMINAL (DRAPES) ×3 IMPLANT
DRAPE LG THREE QUARTER DISP (DRAPES) ×6 IMPLANT
DRAPE UTILITY XL STRL (DRAPES) ×6 IMPLANT
DRAPE WARM FLUID 44X44 (DRAPE) ×3 IMPLANT
DRSG OPSITE POSTOP 4X10 (GAUZE/BANDAGES/DRESSINGS) ×3 IMPLANT
DRSG OPSITE POSTOP 4X6 (GAUZE/BANDAGES/DRESSINGS) IMPLANT
DRSG OPSITE POSTOP 4X8 (GAUZE/BANDAGES/DRESSINGS) IMPLANT
DRSG TEGADERM 2-3/8X2-3/4 SM (GAUZE/BANDAGES/DRESSINGS) ×6 IMPLANT
ELECT REM PT RETURN 9FT ADLT (ELECTROSURGICAL) ×3
ELECTRODE REM PT RTRN 9FT ADLT (ELECTROSURGICAL) ×1 IMPLANT
EVACUATOR SILICONE 100CC (DRAIN) IMPLANT
FILTER SMOKE EVAC LAPAROSHD (FILTER) ×3 IMPLANT
GAUZE SPONGE 2X2 8PLY STRL LF (GAUZE/BANDAGES/DRESSINGS) ×1 IMPLANT
GLOVE BIO SURGEON STRL SZ 6.5 (GLOVE) ×4 IMPLANT
GLOVE BIO SURGEONS STRL SZ 6.5 (GLOVE) ×2
GLOVE BIOGEL PI IND STRL 7.0 (GLOVE) ×6 IMPLANT
GLOVE BIOGEL PI INDICATOR 7.0 (GLOVE) ×12
GLOVE ECLIPSE 6.5 STRL STRAW (GLOVE) ×9 IMPLANT
GLOVE ECLIPSE 8.0 STRL XLNG CF (GLOVE) ×9 IMPLANT
GLOVE INDICATOR 8.0 STRL GRN (GLOVE) ×6 IMPLANT
GOWN L4 LG 24 PK N/S (GOWN DISPOSABLE) ×6 IMPLANT
GOWN STRL REUS W/TWL 2XL LVL3 (GOWN DISPOSABLE) ×6 IMPLANT
GOWN STRL REUS W/TWL XL LVL3 (GOWN DISPOSABLE) ×12 IMPLANT
KIT BASIN OR (CUSTOM PROCEDURE TRAY) ×6 IMPLANT
LEGGING LITHOTOMY PAIR STRL (DRAPES) ×3 IMPLANT
LIGASURE IMPACT 36 18CM CVD LR (INSTRUMENTS) ×3 IMPLANT
PENCIL BUTTON HOLSTER BLD 10FT (ELECTRODE) ×6 IMPLANT
RELOAD PROXIMATE 75MM BLUE (ENDOMECHANICALS) ×12 IMPLANT
RTRCTR WOUND ALEXIS 18CM MED (MISCELLANEOUS)
SCALPEL HARMONIC ACE (MISCELLANEOUS) ×3 IMPLANT
SCISSORS LAP 5X35 DISP (ENDOMECHANICALS) ×3 IMPLANT
SET IRRIG TUBING LAPAROSCOPIC (IRRIGATION / IRRIGATOR) ×3 IMPLANT
SLEEVE XCEL OPT CAN 5 100 (ENDOMECHANICALS) ×12 IMPLANT
SOLUTION ANTI FOG 6CC (MISCELLANEOUS) ×3 IMPLANT
SPONGE DRAIN TRACH 4X4 STRL 2S (GAUZE/BANDAGES/DRESSINGS) ×3 IMPLANT
SPONGE GAUZE 2X2 STER 10/PKG (GAUZE/BANDAGES/DRESSINGS) ×2
SPONGE GAUZE 4X4 12PLY (GAUZE/BANDAGES/DRESSINGS) IMPLANT
SPONGE LAP 18X18 X RAY DECT (DISPOSABLE) ×9 IMPLANT
STAPLER CIRC CVD 29MM 37CM (STAPLE) ×3 IMPLANT
STAPLER PROXIMATE 75MM BLUE (STAPLE) ×3 IMPLANT
STAPLER VISISTAT 35W (STAPLE) ×3 IMPLANT
SUCTION POOLE TIP (SUCTIONS) ×3 IMPLANT
SUT ETHILON 2 0 PS N (SUTURE) IMPLANT
SUT NYLON 3 0 (SUTURE) ×3 IMPLANT
SUT PDS AB 1 CTX 36 (SUTURE) IMPLANT
SUT PDS AB 1 TP1 96 (SUTURE) IMPLANT
SUT PROLENE 2 0 KS (SUTURE) IMPLANT
SUT PROLENE 2 0 SH DA (SUTURE) ×3 IMPLANT
SUT SILK 2 0 (SUTURE) ×2
SUT SILK 2 0 SH CR/8 (SUTURE) ×3 IMPLANT
SUT SILK 2-0 18XBRD TIE 12 (SUTURE) ×1 IMPLANT
SUT SILK 3 0 (SUTURE) ×2
SUT SILK 3 0 SH CR/8 (SUTURE) ×3 IMPLANT
SUT SILK 3-0 18XBRD TIE 12 (SUTURE) ×1 IMPLANT
SUT VIC AB 2-0 SH 18 (SUTURE) ×3 IMPLANT
SUT VICRYL 2 0 18  UND BR (SUTURE) ×2
SUT VICRYL 2 0 18 UND BR (SUTURE) ×1 IMPLANT
SYR BULB IRRIGATION 50ML (SYRINGE) ×3 IMPLANT
SYS LAPSCP GELPORT 120MM (MISCELLANEOUS)
SYSTEM LAPSCP GELPORT 120MM (MISCELLANEOUS) IMPLANT
TOWEL OR 17X26 10 PK STRL BLUE (TOWEL DISPOSABLE) ×6 IMPLANT
TOWEL OR NON WOVEN STRL DISP B (DISPOSABLE) ×6 IMPLANT
TRAY FOLEY METER SIL LF 16FR (CATHETERS) ×3 IMPLANT
TROCAR BLADELESS OPT 5 100 (ENDOMECHANICALS) ×3 IMPLANT
TROCAR XCEL BLUNT TIP 100MML (ENDOMECHANICALS) IMPLANT
TROCAR XCEL NON-BLD 11X100MML (ENDOMECHANICALS) IMPLANT
TUBING INSUFFLATION 10FT LAP (TUBING) ×3 IMPLANT
YANKAUER SUCT BULB TIP 10FT TU (MISCELLANEOUS) ×6 IMPLANT

## 2013-07-24 NOTE — Progress Notes (Signed)
PROGRESS NOTE   Luke Miller QMV:[id number] DOB: [birth year] DOA: 07/18/2013 PCP: [name], MD  Brief narrative: Luke Miller is an 49 y.o. male with a PMH of testosterone deficiency and anxiety who presented from his PCP office 07/18/13 with a six-week history of worsening abdominal pain, nausea and nonbloody vomiting in the setting of GI 6 contacts. He has also lost approximately 10 pounds since his illness began. A CT scan done on admission showed thickening of the splenic flexure.  Assessment/Plan: Principal Problem:   Presumed colon cancer with large bowel obstruction CT scan findings consistent with thickening of the splenic flexure with impacted stool. GI consultation done 07/19/13. A near completely obstructing mass was found in the mid transverse colon, biopsies obtained and are positive for adenocarcinoma. Surgical consultation urgently requested with colon resection planned for today. Had virtual colonoscopy done 07/21/13. CEA not elevated.  Will need outpatient oncology followup in [place].   Active Problems:   Low TSH Free T4 within normal limits.   Testosterone deficiency Continue testosterone hormone replacement every 2 weeks.   Gen. anxiety disorder Continue Xanax each bedtime.   DVT Prophylaxis Continue SCDs.  Code Status: Full. Family Communication: Wife updated at the bedside 07/24/13. Disposition Plan: Home when stable.   IV access:  Peripheral IV  Medical Consultants:  Dr. Arta Silence, Gastroenterology  Dr. Ralene Ok and Dr. Jackolyn Confer, Surgery  Other Consultants:  None  Anti-infectives:  Cipro 07/18/13--->07/24/13  Flagyl 07/18/13--->07/24/13  HPI/Subjective: Luke Miller feels well overall. No current complaints of pain, no nausea or vomiting.   Objective: Filed Vitals:   07/23/13 0642 07/23/13 1530 07/23/13 2108 07/24/13 0541  BP: 92/72 116/78 105/74 109/65  Pulse: 61 66 57 73  Temp: 97.8 F (36.6 C) 98.2 F (36.8 C) 98.1 F  (36.7 C) 97.6 F (36.4 C)  TempSrc: Oral Oral Oral Oral  Resp: 18 18 16 12   Height:      Weight:      SpO2: 100% 100% 100% 100%    Intake/Output Summary (Last 24 hours) at 07/24/13 1012 Last data filed at 07/23/13 1700  Gross per 24 hour  Intake    480 ml  Output      0 ml  Net    480 ml    Exam: Gen:  NAD Cardiovascular:  RRR, No M/R/G Respiratory:  Lungs CTAB Gastrointestinal:  Abdomen soft, NT/ND, + BS Extremities:  No C/E/C  Data Reviewed: Basic Metabolic Panel:  Recent Labs Lab 07/18/13 1940 07/19/13 0338 07/21/13 0343  NA 138 139 142  K 4.5 3.7 4.0  CL 99 101 104  CO2 27 24 27   GLUCOSE 142* 111* 87  BUN 10 10 9   CREATININE 0.89 0.83 0.99  CALCIUM 9.6 8.8 8.9  MG 2.2  --   --   PHOS 2.7  --   --    GFR Estimated Creatinine Clearance: 108.2 ml/min (by C-G formula based on Cr of 0.99). Liver Function Tests:  Recent Labs Lab 07/18/13 1940 07/19/13 0338  AST 10 8  ALT 7 6  ALKPHOS 76 66  BILITOT 0.3 0.3  PROT 7.7 6.8  ALBUMIN 3.3* 2.9*   Coagulation profile  Recent Labs Lab 07/18/13 1940 07/20/13 1652  INR 1.09 1.24    CBC:  Recent Labs Lab 07/18/13 1940 07/19/13 0338 07/21/13 0343  WBC 8.7 7.3 4.0  NEUTROABS 7.9*  --   --   HGB 13.0 12.2* 11.8*  HCT 40.4 37.9* 37.1*  MCV 75.1* 75.6* 76.2*  PLT 351 327 269   CBG:  Recent Labs Lab 07/20/13 0803 07/21/13 0800 07/22/13 0524 07/23/13 0729 07/24/13 0719  GLUCAP 99 90 85 120* 88   Thyroid function studies No results found for this basename: TSH, T4TOTAL, FREET3, T3FREE, THYROIDAB,  in the last 72 hours Microbiology Recent Results (from the past 240 hour(s))  MRSA PCR SCREENING     Status: None   Collection Time    07/20/13  5:00 PM      Result Value Ref Range Status   MRSA by PCR NEGATIVE  NEGATIVE Final   Comment:            The GeneXpert MRSA Assay (FDA     approved for NASAL specimens     only), is one component of a     comprehensive MRSA colonization      surveillance program. It is not     intended to diagnose MRSA     infection nor to guide or     monitor treatment for     MRSA infections.  SURGICAL PCR SCREEN     Status: None   Collection Time    07/23/13  8:42 PM      Result Value Ref Range Status   MRSA, PCR NEGATIVE  NEGATIVE Final   Staphylococcus aureus NEGATIVE  NEGATIVE Final   Comment:            The Xpert SA Assay (FDA     approved for NASAL specimens     in patients over 92 years of age),     is one component of     a comprehensive surveillance     program.  Test performance has     been validated by [employer] for patients greater     than or equal to 44 year old.     It is not intended     to diagnose infection nor to     guide or monitor treatment.     Procedures and Diagnostic Studies: Ct Abdomen Pelvis Wo Contrast  07/18/2013   CLINICAL DATA Abdominal pain and cramping, weight loss  EXAM CT ABDOMEN AND PELVIS WITHOUT CONTRAST  TECHNIQUE Multidetector CT imaging of the abdomen and pelvis was performed following the standard protocol without IV contrast.  COMPARISON None.  FINDINGS Lung bases clear. Normal heart size. No pericardial or pleural effusion.  Abdomen: Kidneys demonstrate no acute obstruction, hydronephrosis, or obstructing ureteral calculus on either side.  Liver, gallbladder, biliary system, pancreas, spleen, and adrenal glands are within normal limits for age and noncontrast imaging.  No abdominal free fluid, fluid collection, hemorrhage, or abscess.  In the left upper quadrant, the splenic flexure of the colon demonstrates an approximately 11 cm segment of abnormal bowel wall thickening with surrounding strandy pericolonic edema. Small adjacent mesenteric lymph nodes noted, measuring 5 mm or less in size. Appearance is nonspecific by compatible with a short segment of acute colitis versus a colonic malignancy. Proximal to this, there is a large amount of retained stool within the cecum, right colon, and  transverse colon. Transverse colon is also moderately distended with air. This appears to represent some degree of partial colonic obstruction. Small bowel is not dilated.  Pelvis: No pelvic free fluid, fluid collection, hemorrhage, abscess, adenopathy, inguinal abnormality, or hernia. Urinary bladder collapsed. Minor distal colon diverticulosis. Distal colon is collapsed. Prostate gland is mildly enlarged.  Degenerative changes of the spine. Previous T12 corpectomy with fusion. No definite acute  osseous finding.  IMPRESSION Abnormal splenic flexure of the colon in the left upper quadrant with wall thickening, surrounding pericolonic strandy edema and prominent adjacent nonenlarged lymph nodes compatible with either acute colitis versus a colonic malignancy.  Proximal colon is distended with retained formed stool and air, compatible with some degree of partial colonic obstruction.  No free air or abscess  These results were called by telephone at the time of interpretation on 07/18/2013 at 2:34 PM to Dr. [name] , who verbally acknowledged these results.  SIGNATURE  Electronically Signed   By: Daryll Brod M.D.   On: 07/18/2013 14:36   Dg Chest 2 View  07/18/2013   CLINICAL DATA Nausea and weight loss.  EXAM CHEST - 2 VIEW  COMPARISON None  FINDINGS The heart size and mediastinal contours are within normal limits. There is no evidence of pulmonary edema, consolidation, pneumothorax, nodule or pleural fluid. There is evidence of prior spinal fusion and corpectomy in the lower thoracic spine/upper lumbar spine.  IMPRESSION No active disease.  SIGNATURE  Electronically Signed   By: Aletta Edouard M.D.   On: 07/18/2013 13:33   Dg Abd 1 View  07/18/2013   CLINICAL DATA:  Nausea and weight loss  EXAM: ABDOMEN - 1 VIEW  COMPARISON:  None.  FINDINGS: The upper abdomen is not completely included in these images. The bowel gas pattern is nonobstructive. There are postsurgical changes of fusion spanning T12 through  L2.  IMPRESSION: Nonobstructive bowel gas pattern. The upper abdomen is not completely included in the images.   Electronically Signed   By: Curlene Dolphin M.D.   On: 07/18/2013 11:58    Scheduled Meds: . [MAR HOLD] ALPRAZolam  1.5 mg Oral QHS  . [MAR HOLD] feeding supplement (RESOURCE BREEZE)  1 Container Oral BID BM  . [facility] HOLD] nicotine  21 mg Transdermal Daily  . [MAR HOLD] polyethylene glycol-electrolytes  2,000 mL Oral Once  . [facility] HOLD] testosterone cypionate  200 mg Intramuscular Q14 Days   Continuous Infusions: . sodium chloride 20 mL/hr at 07/22/13 0700    Time spent: 25 minutes.   LOS: 6 days   [facility] Pager [phone]. If unable to reach me by pager, please call my cell phone at [phone].  *Please note that the hospitalists switch teams on Wednesdays. Please call the flow manager at [phone] if you are having difficulty reaching the hospitalist taking care of this patient as she can update you and provide the most up-to-date pager number of provider caring for the patient. If 8PM-8AM, please contact night-coverage at [email or web address], password [facility]  07/24/2013, 10:12 AM    **Disclaimer: This note was dictated with voice recognition software. Similar sounding words can inadvertently be transcribed and this note may contain transcription errors which may not have been corrected upon publication of note.**

## 2013-07-24 NOTE — Progress Notes (Signed)
Diagnosis 1. Colon, polyp(s), sigmoid - TUBULAR ADENOMA (1 FRAGMENT). - RESECTION MARGIN, NEGATIVE FOR DYSPLASIA OR MALIGNANCY. 2. 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 Microscopic Comment 2. Dr. Adrian Blackwater is notified on 07/21/13. Dr. Gari Crown agrees. MMR stains will be performed and an addendum report will follow. (HCL:caf 07/21/13) Aldona Bar MD Pathologist, Electronic Signature (Case signed 07/21/2013) Specimen Gross and Clinical Information Specimen(s) Obtained: 1. Colon, polyp(s), sigmoid 2. Colon, biopsy, splenic flexure Specimen Clinical Information 1. abdominal pain, abnormal CT scan abdomen; splenic flexure mass (kp) Gross 1. Received in formalin is a 0.8 cm tan red polyp, bisected, one block. 2. Received in formalin are tan, soft tissue fragments that are submitted in toto. Number: ten, Size: 0.1 to 0.3 cm, one block. (SW:caf 07/20/13) Stain(s) used in Diagnosis: The following stain(s) were used in diagnosing the case: MSH6, MLH1, MSH2, PMS2. The control(s) stained appropriately. 1 of 2 FINAL for [name], Luke Miller ([id number]) Disclaimer Some of these immunohistochemical stains may have been developed and the performance characteristics determined by [facility]. Some may not have been cleared or approved by the U.S. Food and Drug Administration. The FDA has determined that such clearance or approval is not necessary. This test is used for clinical purposes. It should not be regarded as investigational or for research. This laboratory is certified under the [employer] (CLIA-88) as qualified to perform high complexity clinical laboratory testing. Report signed out from the following location(s) Technical Component and Interpretation performed at [facility].[place], [place], [place] [address]. CLIA #: [id number], 2 of

## 2013-07-24 NOTE — Transfer of Care (Signed)
Immediate Anesthesia Transfer of Care Note  Patient: Luke Miller  Procedure(s) Performed: Procedure(s): LAPAROSCOPIC ASSISTED NEAR SUBTOTAL COLECTOMY WITH INCIDENTAL APPENDECTOMY (N/A)  Patient Location: PACU  Anesthesia Type:General  Level of Consciousness: awake, alert  and oriented  Airway & Oxygen Therapy: Patient Spontanous Breathing and Patient connected to face mask oxygen  Post-op Assessment: Report given to PACU RN and Post -op Vital signs reviewed and stable  Post vital signs: Reviewed and stable  Complications: No apparent anesthesia complications

## 2013-07-24 NOTE — Progress Notes (Signed)
07/24/13 0730  Called to get pre op orders for a surgical wash and type and screen if needed. Spoke with PA ok to do CHG cloth wipes and the doctor will place orders that are need when he arrives.

## 2013-07-24 NOTE — Preoperative (Signed)
Beta Blockers   Reason not to administer Beta Blockers:Not Applicable 

## 2013-07-24 NOTE — Anesthesia Postprocedure Evaluation (Signed)
  Anesthesia Post-op Note  Patient: Luke Miller  Procedure(s) Performed: Procedure(s) (LRB): LAPAROSCOPIC ASSISTED NEAR SUBTOTAL COLECTOMY WITH INCIDENTAL APPENDECTOMY (N/A)  Patient Location: PACU  Anesthesia Type: General  Level of Consciousness: awake and alert   Airway and Oxygen Therapy: Patient Spontanous Breathing  Post-op Pain: mild  Post-op Assessment: Post-op Vital signs reviewed, Patient's Cardiovascular Status Stable, Respiratory Function Stable, Patent Airway and No signs of Nausea or vomiting  Last Vitals:  Filed Vitals:   07/24/13 1545  BP: 143/87  Pulse: 107  Temp: 36.7 C  Resp: 20    Post-op Vital Signs: stable   Complications: No apparent anesthesia complications

## 2013-07-24 NOTE — Op Note (Signed)
Operative Note  Luke Miller male 48 y.o. 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         Surgeon: [name]   Assistants: Leighton Ruff, M.D.  Anesthesia: General endotracheal anesthesia  Indications:   This is a 48 year old male admitted last week with severe abdominal pain. He was discovered to have a near obstructing lesion at the splenic flexure. Colonoscopy demonstrated this to be in constricting lesion and biopsy was positive for adenocarcinoma. The colonoscope could not be passed proximal to the lesion. A virtual colonoscopy did not demonstrate any other colonic lesions. There was no evidence of metastatic disease present. He now presents for the above procedure.  He has had a slow, gentle bowel prep.    Procedure Detail:  He was brought to the operating room placed supine on the operating table and a general anesthetic was administered. A Foley catheter was inserted. He was placed in the lithotomy position. The hair on the abdominal wall was clipped. The abdominal wall and perineal areas were then sterilely prepped and draped.  A 5 mm incision was made in the right subcostal area. Using a 5 mm Optiview trocar and laparoscope I gained access to the peritoneal cavity. A pneumoperitoneum was created. Inspection of the trocar and the area deep to it did not demonstrate any evidence of bleeding organ injury. A 5 mm trocar was placed in the right lower quadrant and the left lower quadrant. A 5 mm trocar was placed in the subumbilical region. The left upper quadrant area was inspected. Omentum was adherent to a mass at the splenic flexure. Beginning at the sigmoid colon and rectum junction of began mobilizing the left colon by dividing its lateral attachments. During this mobilization identified the left ureter and  preserved it. The plane of dissection was kept anterior to this. I subsequently began mobilizing some of the splenic flexure. There were dense adhesions between the tumor and the spleen. I then began mobilizing the omentum off the transverse colon well proximal to the tumor. I entered the lesser sac. They continued to mobilize the distal transverse colon and splenic flexure in this fashion. I came to a point where I could no longer separate the lesion safely from the retroperitoneal areas as well as the tail of the pancreas area. I subsequently placed a trocar in the suprapubic region. I then mobilized the rectosigmoid junction and proximal rectum by dividing the lateral and medial attachments. At this point I decided to convert to an open procedure to be over the mobilize the tumor out of the retroperitoneum.  The trocars were removed. A long midline incision was made dividing the skin, subcutaneous tissue, fascia, and peritoneum. A self-retaining retractor was used. I then was able to separate the tumor and mesentery from the spleen and the tail of the pancreas using the LigaSure device. The pancreas and the spleen were intact. I then divided the transverse colon well proximal to the tumor with the GIA stapler. I divided the distal colon at the rectosigmoid junction. The mesentery was then mobilized and divided using ligature. I ligated the inferior mesenteric artery close to its origin. Once the mesentery been completely divided, the specimen was handed off the field and a suture marked the rectosigmoid junction.  I then mobilized the hepatic flexure in order to try to approximate the transverse colon to the rectum without  tension but this could not be done. I divided the middle colic artery close to his origin and left the marginal artery intact. However this did not provide good backflow blood supply from the right colic artery.  The transverse colon became ischemic. Subsequently I mobilized the rest of the  right colon and end up resecting more transverse colon as well as the majority of the right colon. I did leave the cecum ileocecal valve and approximately 15 cm of right colon. I divided the colon the stapler and divided the mesentery close to the colon. I also performed an appendectomy by dividing the mesial appendix and amputating the appendix off the cecum with the GIA stapler. After the specimens were handed off the field, I was able to approximate the remaining ascending colon to the rectum without tension.  I removed the staple line on the remaining right colon and placed a size 29 EEA anvil into the colon. I then reclosed the colotomy with the linear cutting stapler. I brought the anvil out the side of the colon in secured it with a 2-0 Prolene pursestring suture. Subsequently an endorectal to side right colon anastomosis was performed with the size 29 EEA stapler. The anastomosis was patent, viable, and under no tension. Leak test was performed and there is no evidence of air leak.  Following this the abdominal cavity was copiously irrigated with saline solution. There is a serosal defect and part of the small intestine and the stomach. These were closed with interrupted silk sutures in a Lembert-type fashion.  Inspection of the abdominal cavity demonstrated no evidence of organ injury or bleeding. I placed a size 19 Blake drain into the left lower quadrant trocar site and positioned in the pelvis. It was anchored to the skin with 3-0 nylon suture. The fascia was then closed with a running double looped #1 PDS suture. The subcutaneous tissue was irrigated. All skin incisions were closed with staples. Sterile dressings were applied.  He tolerated the procedure well without any apparent complications and was taken to the recovery room in satisfactory condition.  Estimated Blood Loss:  400 ml         Drains: #19 Blake drain.  Blood Given: none          Specimens: Majority of the colon. The  appendix.        Complications:  * No complications entered in OR log *         Disposition: PACU - hemodynamically stable.         Condition: stable

## 2013-07-24 NOTE — Progress Notes (Signed)
4 Days Post-Op  Subjective: Patient seen and examined.  Chart reviewed.  No complaints.  Wife in room with him.  Objective: Vital signs in last 24 hours: Temp:  [97.6 F (36.4 C)-98.2 F (36.8 C)] 97.6 F (36.4 C) (03/16 0541) Pulse Rate:  [57-73] 73 (03/16 0541) Resp:  [12-18] 12 (03/16 0541) BP: (105-116)/(65-78) 109/65 mmHg (03/16 0541) SpO2:  [100 %] 100 % (03/16 0541) Last BM Date: 07/23/13  Intake/Output from previous day: 03/15 0701 - 03/16 0700 In: 720 [P.O.:720] Out: -  Intake/Output this shift:    PE: General- In NAD Abdomen-soft, flat, nontender, nondistended, skin marked on right and left sides.  Lab Results:  No results found for this basename: WBC, HGB, HCT, PLT,  in the last 72 hours BMET No results found for this basename: NA, K, CL, CO2, GLUCOSE, BUN, CREATININE, CALCIUM,  in the last 72 hours PT/INR No results found for this basename: LABPROT, INR,  in the last 72 hours Comprehensive Metabolic Panel:    Component Value Date/Time   NA 142 07/21/2013 0343   NA 139 07/19/2013 0338   K 4.0 07/21/2013 0343   K 3.7 07/19/2013 0338   CL 104 07/21/2013 0343   CL 101 07/19/2013 0338   CO2 27 07/21/2013 0343   CO2 24 07/19/2013 0338   BUN 9 07/21/2013 0343   BUN 10 07/19/2013 0338   CREATININE 0.99 07/21/2013 0343   CREATININE 0.83 07/19/2013 0338   GLUCOSE 87 07/21/2013 0343   GLUCOSE 111* 07/19/2013 0338   CALCIUM 8.9 07/21/2013 0343   CALCIUM 8.8 07/19/2013 0338   AST 8 07/19/2013 0338   AST 10 07/18/2013 1940   ALT 6 07/19/2013 0338   ALT 7 07/18/2013 1940   ALKPHOS 66 07/19/2013 0338   ALKPHOS 76 07/18/2013 1940   BILITOT 0.3 07/19/2013 0338   BILITOT 0.3 07/18/2013 1940   PROT 6.8 07/19/2013 0338   PROT 7.7 07/18/2013 1940   ALBUMIN 2.9* 07/19/2013 0338   ALBUMIN 3.3* 07/18/2013 1940     Studies/Results: No results found.  Anti-infectives: Anti-infectives   Start     Dose/Rate Route Frequency Ordered Stop   07/24/13 0845  cefoTEtan (CEFOTAN) 1 g in dextrose  5 % 50 mL IVPB     1 g 100 mL/hr over 30 Minutes Intravenous  Once 07/24/13 0842     07/23/13 1000  neomycin (MYCIFRADIN) tablet 1,000 mg     1,000 mg Oral 3 times daily between meals 07/22/13 0903 07/23/13 2039   07/23/13 1000  erythromycin (E-MYCIN) tablet 1,000 mg     1,000 mg Oral 3 times daily between meals 07/22/13 0903 07/23/13 2039   07/18/13 1830  ciprofloxacin (CIPRO) IVPB 400 mg  Status:  Discontinued     400 mg 200 mL/hr over 60 Minutes Intravenous Every 12 hours 07/18/13 1756 07/21/13 1023   07/18/13 1830  metroNIDAZOLE (FLAGYL) IVPB 500 mg  Status:  Discontinued     500 mg 100 mL/hr over 60 Minutes Intravenous Every 8 hours 07/18/13 1756 07/21/13 1023      Assessment Principal Problem:   Adenocarcinoma of colon, partially obstructing.    LOS: 6 days   Plan:   Laparoscopic assisted, possible open partial colectomy.  I have explained the procedure and risks of colon resection.  Risks include but are not limited to bleeding, infection, wound problems, anesthesia, anastomotic leak, need for colostomy, injury to intraabominal organs (such as intestine, spleen, kidney, bladder, ureter, etc.), need for reoperation, ileus.  He  seems to understand and agrees to proceed.   Luke Miller,Luke Miller 07/24/2013

## 2013-07-24 NOTE — Anesthesia Preprocedure Evaluation (Addendum)
Anesthesia Evaluation  Patient identified by MRN, date of birth, ID band Patient awake    Reviewed: Allergy & Precautions, H&P , NPO status , Patient's Chart, lab work & pertinent test results  Airway Mallampati: II TM Distance: >3 FB Neck ROM: full    Dental no notable dental hx. (+) Teeth Intact, Dental Advisory Given   Pulmonary neg pulmonary ROS,  breath sounds clear to auscultation  Pulmonary exam normal       Cardiovascular Exercise Tolerance: Good negative cardio ROS  Rhythm:regular Rate:Normal     Neuro/Psych Anxiety negative neurological ROS  negative psych ROS   GI/Hepatic negative GI ROS, Neg liver ROS,   Endo/Other  negative endocrine ROS  Renal/GU negative Renal ROS  negative genitourinary   Musculoskeletal   Abdominal   Peds  Hematology negative hematology ROS (+)   Anesthesia Other Findings   Reproductive/Obstetrics negative OB ROS                          Anesthesia Physical Anesthesia Plan  ASA: II  Anesthesia Plan: General   Post-op Pain Management:    Induction: Intravenous  Airway Management Planned: Oral ETT  Additional Equipment:   Intra-op Plan:   Post-operative Plan: Extubation in OR  Informed Consent: I have reviewed the patients History and Physical, chart, labs and discussed the procedure including the risks, benefits and alternatives for the proposed anesthesia with the patient or authorized representative who has indicated his/her understanding and acceptance.   Dental Advisory Given  Plan Discussed with: CRNA and Surgeon  Anesthesia Plan Comments:         Anesthesia Quick Evaluation

## 2013-07-24 NOTE — Brief Op Note (Signed)
07/18/2013 - 07/24/2013  2:21 PM  PATIENT:  Luke Miller  48 y.o. male  PRE-OPERATIVE DIAGNOSIS:  colon cancer  POST-OPERATIVE DIAGNOSIS:  colon cancer  PROCEDURE:  Procedure(s): LAPAROSCOPIC ASSISTED NEAR SUBTOTAL COLECTOMY WITH INCIDENTAL APPENDECTOMY (N/A)  SURGEON:  Surgeon(s) and Role:    * [name], MD - Primary    * Leighton Ruff, MD - Assisting    ANESTHESIA:   general  EBL:  Total I/O In: 3000 [I.V.:3000] Out: 1200 [Urine:1000; Blood:200]  BLOOD ADMINISTERED:none  DRAINS: #19 blake drain in pelvis   LOCAL MEDICATIONS USED:  MARCAINE     SPECIMEN:  Source of Specimen:  colon  DISPOSITION OF SPECIMEN:  PATHOLOGY  COUNTS:  YES  TOURNIQUET:  * No tourniquets in log *  DICTATION: .Dragon Dictation  PLAN OF CARE: Admit to inpatient   PATIENT DISPOSITION:  PACU - hemodynamically stable.   Delay start of Pharmacological VTE agent (>24hrs) due to surgical blood loss or risk of bleeding: yes

## 2013-07-24 NOTE — Progress Notes (Signed)
Noted  

## 2013-07-25 ENCOUNTER — Encounter (HOSPITAL_COMMUNITY): Payer: Self-pay | Admitting: General Surgery

## 2013-07-25 ENCOUNTER — Inpatient Hospital Stay (HOSPITAL_COMMUNITY): Payer: BC Managed Care – PPO

## 2013-07-25 LAB — URINALYSIS, ROUTINE W REFLEX MICROSCOPIC
Bilirubin Urine: NEGATIVE
Glucose, UA: NEGATIVE mg/dL
Hgb urine dipstick: NEGATIVE
Ketones, ur: NEGATIVE mg/dL
Leukocytes, UA: NEGATIVE
Nitrite: NEGATIVE
Protein, ur: NEGATIVE mg/dL
Specific Gravity, Urine: 1.007 (ref 1.005–1.030)
Urobilinogen, UA: 0.2 mg/dL (ref 0.0–1.0)
pH: 6 (ref 5.0–8.0)

## 2013-07-25 LAB — BASIC METABOLIC PANEL
BUN: 7 mg/dL (ref 6–23)
CALCIUM: 8.8 mg/dL (ref 8.4–10.5)
CHLORIDE: 96 meq/L (ref 96–112)
CO2: 26 meq/L (ref 19–32)
CREATININE: 0.96 mg/dL (ref 0.50–1.35)
GFR calc Af Amer: >90 mL/min (ref >90)
GFR calc non Af Amer: >90 mL/min (ref >90)
Glucose, Bld: 172 mg/dL — ABNORMAL HIGH (ref 70–99)
Potassium: 4.4 mEq/L (ref 3.7–5.3)
Sodium: 135 mEq/L — ABNORMAL LOW (ref 137–147)

## 2013-07-25 LAB — CBC
HEMATOCRIT: 40 % (ref 39.0–52.0)
Hemoglobin: 13.1 g/dL (ref 13.0–17.0)
MCH: 24.3 pg — ABNORMAL LOW (ref 26.0–34.0)
MCHC: 32.8 g/dL (ref 30.0–36.0)
MCV: 74.3 fL — AB (ref 78.0–100.0)
Platelets: 293 10*3/uL (ref 150–400)
RBC: 5.38 MIL/uL (ref 4.22–5.81)
RDW: 14.1 % (ref 11.5–15.5)
WBC: 12.2 10*3/uL — ABNORMAL HIGH (ref 4.0–10.5)

## 2013-07-25 MED ORDER — KETOROLAC TROMETHAMINE 30 MG/ML IJ SOLN
30.0000 mg | Freq: Four times a day (QID) | INTRAMUSCULAR | Status: AC
  Administered 2013-07-25 – 2013-07-26 (×6): 30 mg via INTRAVENOUS
  Filled 2013-07-25 (×3): qty 1
  Filled 2013-07-25: qty 2
  Filled 2013-07-25 (×3): qty 1

## 2013-07-25 MED ORDER — LORAZEPAM 1 MG PO TABS
1.0000 mg | ORAL_TABLET | Freq: Once | ORAL | Status: AC
  Administered 2013-07-25: 1 mg via ORAL
  Filled 2013-07-25: qty 1

## 2013-07-25 MED ORDER — PANTOPRAZOLE SODIUM 40 MG IV SOLR
40.0000 mg | Freq: Two times a day (BID) | INTRAVENOUS | Status: DC
  Administered 2013-07-25 – 2013-07-26 (×4): 40 mg via INTRAVENOUS
  Filled 2013-07-25 (×6): qty 40

## 2013-07-25 MED ORDER — ACETAMINOPHEN 325 MG PO TABS: 650.0000 mg | ORAL_TABLET | ORAL | Status: DC | PRN

## 2013-07-25 MED ORDER — METOPROLOL TARTRATE 1 MG/ML IV SOLN
2.5000 mg | Freq: Once | INTRAVENOUS | Status: AC
  Administered 2013-07-25: 2.5 mg via INTRAVENOUS
  Filled 2013-07-25: qty 5

## 2013-07-25 MED ORDER — LORAZEPAM 2 MG/ML IJ SOLN
1.0000 mg | Freq: Once | INTRAMUSCULAR | Status: AC
  Administered 2013-07-25: 1 mg via INTRAVENOUS
  Filled 2013-07-25: qty 1

## 2013-07-25 MED ORDER — ALPRAZOLAM 0.5 MG PO TABS
0.5000 mg | ORAL_TABLET | Freq: Once | ORAL | Status: DC
  Filled 2013-07-25: qty 1

## 2013-07-25 MED ORDER — ACETAMINOPHEN 10 MG/ML IV SOLN
1000.0000 mg | Freq: Once | INTRAVENOUS | Status: AC
  Administered 2013-07-25: 1000 mg via INTRAVENOUS
  Filled 2013-07-25: qty 100

## 2013-07-25 NOTE — Progress Notes (Signed)
1 Day Post-Op  Subjective: Very sore.  Having muscle spasms.  Objective: Vital signs in last 24 hours: Temp:  [97.5 F (36.4 C)-101 F (38.3 C)] 98.8 F (37.1 C) (03/17 0517) Pulse Rate:  [80-124] 98 (03/17 0635) Resp:  [13-21] 18 (03/17 0722) BP: (134-162)/(87-99) 147/90 mmHg (03/17 0635) SpO2:  [98 %-100 %] 100 % (03/17 0722) Weight:  [203 lb 4.2 oz (92.2 kg)] 203 lb 4.2 oz (92.2 kg) (03/17 0517) Last BM Date: 07/23/13  Intake/Output from previous day: 03/16 0701 - 03/17 0700 In: 3737.5 [I.V.:3737.5] Out: 3830 [Urine:3320; Drains:310; Blood:200] Intake/Output this shift:    PE: General- In NAD Abdomen-soft, dried drainage on dressing, serosanguinous drain output  Lab Results:   Recent Labs  07/25/13 0045  WBC 12.2*  HGB 13.1  HCT 40.0  PLT 293   BMET  Recent Labs  07/25/13 0045  NA 135*  K 4.4  CL 96  CO2 26  GLUCOSE 172*  BUN 7  CREATININE 0.96  CALCIUM 8.8   PT/INR No results found for this basename: LABPROT, INR,  in the last 72 hours Comprehensive Metabolic Panel:    Component Value Date/Time   NA 135* 07/25/2013 0045   NA 142 07/21/2013 0343   K 4.4 07/25/2013 0045   K 4.0 07/21/2013 0343   CL 96 07/25/2013 0045   CL 104 07/21/2013 0343   CO2 26 07/25/2013 0045   CO2 27 07/21/2013 0343   BUN 7 07/25/2013 0045   BUN 9 07/21/2013 0343   CREATININE 0.96 07/25/2013 0045   CREATININE 0.99 07/21/2013 0343   GLUCOSE 172* 07/25/2013 0045   GLUCOSE 87 07/21/2013 0343   CALCIUM 8.8 07/25/2013 0045   CALCIUM 8.9 07/21/2013 0343   AST 8 07/19/2013 0338   AST 10 07/18/2013 1940   ALT 6 07/19/2013 0338   ALT 7 07/18/2013 1940   ALKPHOS 66 07/19/2013 0338   ALKPHOS 76 07/18/2013 1940   BILITOT 0.3 07/19/2013 0338   BILITOT 0.3 07/18/2013 1940   PROT 6.8 07/19/2013 0338   PROT 7.7 07/18/2013 1940   ALBUMIN 2.9* 07/19/2013 0338   ALBUMIN 3.3* 07/18/2013 1940     Studies/Results: No results found.  Anti-infectives: Anti-infectives   Start     Dose/Rate Route  Frequency Ordered Stop   07/24/13 2200  cefoTEtan (CEFOTAN) 2 g in dextrose 5 % 50 mL IVPB     2 g 100 mL/hr over 30 Minutes Intravenous Every 12 hours 07/24/13 1605 07/24/13 2214   07/24/13 0845  [MAR Hold]  cefoTEtan (CEFOTAN) 1 g in dextrose 5 % 50 mL IVPB     (On MAR Hold since 07/24/13 0854)   1 g 100 mL/hr over 30 Minutes Intravenous  Once 07/24/13 0842 07/24/13 0953   07/23/13 1000  neomycin (MYCIFRADIN) tablet 1,000 mg     1,000 mg Oral 3 times daily between meals 07/22/13 0903 07/23/13 2039   07/23/13 1000  erythromycin (E-MYCIN) tablet 1,000 mg     1,000 mg Oral 3 times daily between meals 07/22/13 0903 07/23/13 2039   07/18/13 1830  ciprofloxacin (CIPRO) IVPB 400 mg  Status:  Discontinued     400 mg 200 mL/hr over 60 Minutes Intravenous Every 12 hours 07/18/13 1756 07/21/13 1023   07/18/13 1830  metroNIDAZOLE (FLAGYL) IVPB 500 mg  Status:  Discontinued     500 mg 100 mL/hr over 60 Minutes Intravenous Every 8 hours 07/18/13 1756 07/21/13 1023      Assessment Principal Problem: Partially obstructing left  colon cancer s/p near subtotal colectomy 07/24/13-some fever overnight likely due to atelectasis.   LOS: 7 days   Plan:  Add Toradol.  Clear liquids.  OOB.   Luke Miller,Luke Miller 07/25/2013

## 2013-07-25 NOTE — Progress Notes (Addendum)
[facility] PROGRESS NOTE  Luke Miller OIZ:[id number] DOB: [birth year] DOA: 07/18/2013 PCP: [name], MD  Brief narrative: Luke Miller is an 48 y.o. male with a PMH of testosterone deficiency and anxiety who presented from his PCP office 07/18/13 with a six-week history of worsening abdominal pain, nausea and nonbloody vomiting. He has also lost approximately 10 pounds since his illness began. A CT scan done on admission showed thickening of the splenic flexure.   Assessment/Plan:  Principal Problem:  Partially obstructing left colon cancer  - s/p near subtotal colectomy 07/24/13, post op day #1 - pt still sore with Tmax 101 F - appreciate surgery input - advance diet to clear liquid and OOB Active Problems:  Leukocytosis with fever - possibly post op related - given fevers overnight, will have to monitor closely  - hold off on ABX for now as no clear infectious etiology identified - CXR with no acute abnormalities  - repeat CBC in AM Low TSH  - Free T4 within normal limits.  Testosterone deficiency  - Continue testosterone hormone replacement every 2 weeks.  Gen. anxiety disorder  - Continue Xanax each bedtime.  DVT Prophylaxis  Continue SCDs.   Code Status: Full.  Family Communication: Wife updated at the bedside Disposition Plan: Home when stable.   IV access:  Peripheral IV Medical Consultants:  Dr. Arta Silence, Gastroenterology  Dr. Ralene Ok and Dr. Jackolyn Confer, Surgery Other Consultants:  None Anti-infectives:  Cipro 07/18/13--->07/24/13  Flagyl 07/18/13--->07/24/13  Leisa Lenz, MD  [facility] Pager [phone]  If 7PM-7AM, please contact night-coverage [email or web address] Password [email or web address] 07/25/2013, 2:04 PM   LOS: 7 days   HPI/Subjective: Sore with muscle spasms, denies chest pain or shortness of breath.   Objective: Filed Vitals:   07/25/13 0517 07/25/13 0635 07/25/13 0722 07/25/13 1324  BP: 162/95 147/90    Pulse: 122 98     Temp: 98.8 F (37.1 C)     TempSrc: Oral     Resp: 18  18 18   Height:      Weight: 92.2 kg (203 lb 4.2 oz)     SpO2: 100%  96% 42%    Intake/Output Summary (Last 24 hours) at 07/25/13 1404 Last data filed at 07/25/13 1041  Gross per 24 hour  Intake 2735.5 ml  Output   3220 ml  Net -484.5 ml    Exam:   General:  Pt is alert, follows commands appropriately, not in acute distress  Cardiovascular: Regular rate and rhythm, S1/S2, no murmurs, no rubs, no gallops  Respiratory: Clear to auscultation bilaterally, no wheezing, no crackles, no rhonchi  Abdomen: Tender, no guarding   Extremities: No edema, pulses DP and PT palpable bilaterally  Neuro: Grossly nonfocal  Data Reviewed: Basic Metabolic Panel:  Recent Labs Lab 07/18/13 1940 07/19/13 0338 07/21/13 0343 07/25/13 0045  NA 138 139 142 135*  K 4.5 3.7 4.0 4.4  CL 99 101 104 96  CO2 27 24 27 26   GLUCOSE 142* 111* 87 172*  BUN 10 10 9 7   CREATININE 0.89 0.83 0.99 0.96  CALCIUM 9.6 8.8 8.9 8.8  MG 2.2  --   --   --   PHOS 2.7  --   --   --    Liver Function Tests:  Recent Labs Lab 07/18/13 1940 07/19/13 0338  AST 10 8  ALT 7 6  ALKPHOS 76 66  BILITOT 0.3 0.3  PROT 7.7 6.8  ALBUMIN 3.3* 2.9*   CBC:  Recent Labs Lab 07/18/13  1940 07/19/13 0338 07/21/13 0343 07/25/13 0045  WBC 8.7 7.3 4.0 12.2*  NEUTROABS 7.9*  --   --   --   HGB 13.0 12.2* 11.8* 13.1  HCT 40.4 37.9* 37.1* 40.0  MCV 75.1* 75.6* 76.2* 74.3*  PLT 351 327 269 293   Cardiac Enzymes: No results found for this basename: CKTOTAL, CKMB, CKMBINDEX, TROPONINI,  in the last 168 hours BNP: No components found with this basename: POCBNP,  CBG:  Recent Labs Lab 07/20/13 0803 07/21/13 0800 07/22/13 0524 07/23/13 0729 07/24/13 0719  GLUCAP 99 90 85 120* 88    Recent Results (from the past 240 hour(s))  MRSA PCR SCREENING     Status: None   Collection Time    07/20/13  5:00 PM      Result Value Ref Range Status   MRSA by PCR  NEGATIVE  NEGATIVE Final   Comment:            The GeneXpert MRSA Assay (FDA     approved for NASAL specimens     only), is one component of a     comprehensive MRSA colonization     surveillance program. It is not     intended to diagnose MRSA     infection nor to guide or     monitor treatment for     MRSA infections.  SURGICAL PCR SCREEN     Status: None   Collection Time    07/23/13  8:42 PM      Result Value Ref Range Status   MRSA, PCR NEGATIVE  NEGATIVE Final   Staphylococcus aureus NEGATIVE  NEGATIVE Final   Comment:            The Xpert SA Assay (FDA     approved for NASAL specimens     in patients over 62 years of age),     is one component of     a comprehensive surveillance     program.  Test performance has     been validated by [employer] for patients greater     than or equal to 56 year old.     It is not intended     to diagnose infection nor to     guide or monitor treatment.     Studies: Dg Chest Port 1 View  07/25/2013   CLINICAL DATA:  Fever.  EXAM: PORTABLE CHEST - 1 VIEW  COMPARISON:  Chest x-ray 01/04/2012.  FINDINGS: Lung volumes are low. No consolidative airspace disease. No pleural effusions. No evidence of pulmonary edema. Heart size is normal. The patient is rotated to the left on today's exam, resulting in distortion of the mediastinal contours and reduced diagnostic sensitivity and specificity for mediastinal pathology. Orthopedic fixation hardware in the upper lumbar spine.  IMPRESSION: Low lung volumes without radiographic evidence of acute cardiopulmonary disease.   Electronically Signed   By: Vinnie Langton M.D.   On: 07/25/2013 08:06    Scheduled Meds: . heparin subcutaneous  5,000 Units Subcutaneous 3 times per day  . ketorolac  30 mg Intravenous 4 times per day  . morphine   Intravenous 6 times per day  . pantoprazole (PROTONIX) IV  40 mg Intravenous Q24H   Continuous Infusions: . dextrose 5% lactated ringers with KCl 20 mEq/L  100 mL/hr at 07/25/13 1041

## 2013-07-25 NOTE — Progress Notes (Signed)
Chaplain provided ministry of presence. Family at bedside. Chaplain offered pastoral care and support to patient/family. Patient and family said "they have no pastoral needs." Chaplain will follow up if needed or requested.   07/25/13 1500  Clinical Encounter Type  Visited With Patient and family together  Visit Type Initial

## 2013-07-25 NOTE — Progress Notes (Signed)
Pt had a temp of 101.0 around 0040 and needed something to help him with anxiety and sleep. MD oncall was notified and orders were given. Pt was able to rest for about four hours. This a.m. B/P and pulse was elevated ON CALL MD was notified and orders have been given. Will administer and give feedback to MD.

## 2013-07-25 NOTE — Progress Notes (Signed)
Pt B/P came down to 147/90 and pulse 98. MD notified.

## 2013-07-26 LAB — BASIC METABOLIC PANEL
BUN: 6 mg/dL (ref 6–23)
CALCIUM: 8.5 mg/dL (ref 8.4–10.5)
CO2: 30 mEq/L (ref 19–32)
Chloride: 100 mEq/L (ref 96–112)
Creatinine, Ser: 1 mg/dL (ref 0.50–1.35)
GFR, EST NON AFRICAN AMERICAN: 87 mL/min — AB (ref >90)
Glucose, Bld: 113 mg/dL — ABNORMAL HIGH (ref 70–99)
Potassium: 4.4 mEq/L (ref 3.7–5.3)
SODIUM: 136 meq/L — AB (ref 137–147)

## 2013-07-26 LAB — CBC
HCT: 33.9 % — ABNORMAL LOW (ref 39.0–52.0)
HEMOGLOBIN: 10.5 g/dL — AB (ref 13.0–17.0)
MCH: 23.8 pg — AB (ref 26.0–34.0)
MCHC: 31 g/dL (ref 30.0–36.0)
MCV: 76.9 fL — ABNORMAL LOW (ref 78.0–100.0)
PLATELETS: 235 10*3/uL (ref 150–400)
RBC: 4.41 MIL/uL (ref 4.22–5.81)
RDW: 14.8 % (ref 11.5–15.5)
WBC: 8.7 10*3/uL (ref 4.0–10.5)

## 2013-07-26 MED ORDER — HYDROMORPHONE 0.3 MG/ML IV SOLN
INTRAVENOUS | Status: DC
  Administered 2013-07-26: 3 mg via INTRAVENOUS
  Administered 2013-07-26 (×2): via INTRAVENOUS
  Administered 2013-07-26: 6 mg via INTRAVENOUS
  Administered 2013-07-26: 1.8 mg via INTRAVENOUS
  Administered 2013-07-27: 07:00:00 via INTRAVENOUS
  Administered 2013-07-27: 2.1 mg via INTRAVENOUS
  Administered 2013-07-27: 8.1 mg via INTRAVENOUS
  Administered 2013-07-27: via INTRAVENOUS
  Filled 2013-07-26 (×4): qty 25

## 2013-07-26 MED ORDER — ONDANSETRON HCL 4 MG/2ML IJ SOLN
4.0000 mg | Freq: Four times a day (QID) | INTRAMUSCULAR | Status: DC | PRN
  Administered 2013-07-27: 4 mg via INTRAVENOUS
  Filled 2013-07-26: qty 2

## 2013-07-26 MED ORDER — DIPHENHYDRAMINE HCL 50 MG/ML IJ SOLN: 12.5000 mg | Freq: Four times a day (QID) | INTRAMUSCULAR | Status: DC | PRN

## 2013-07-26 MED ORDER — KCL-LACTATED RINGERS-D5W 20 MEQ/L IV SOLN
INTRAVENOUS | Status: DC
  Administered 2013-07-26 – 2013-07-27 (×2): via INTRAVENOUS
  Administered 2013-07-28: 100 mL/h via INTRAVENOUS
  Filled 2013-07-26 (×5): qty 1000

## 2013-07-26 MED ORDER — CALCIUM CARBONATE ANTACID 500 MG PO CHEW
400.0000 mg | CHEWABLE_TABLET | Freq: Three times a day (TID) | ORAL | Status: DC | PRN
  Administered 2013-07-26: 400 mg via ORAL
  Filled 2013-07-26: qty 2

## 2013-07-26 MED ORDER — NALOXONE HCL 0.4 MG/ML IJ SOLN: 0.4000 mg | INTRAMUSCULAR | Status: DC | PRN

## 2013-07-26 MED ORDER — BOOST / RESOURCE BREEZE PO LIQD
1.0000 | Freq: Three times a day (TID) | ORAL | Status: DC
  Administered 2013-07-26 – 2013-07-29 (×5): 1 via ORAL

## 2013-07-26 MED ORDER — CALCIUM CARBONATE ANTACID 500 MG PO CHEW: 1000.0000 mg | CHEWABLE_TABLET | Freq: Three times a day (TID) | ORAL | Status: DC | PRN

## 2013-07-26 MED ORDER — DIPHENHYDRAMINE HCL 12.5 MG/5ML PO ELIX: 12.5000 mg | ORAL_SOLUTION | Freq: Four times a day (QID) | ORAL | Status: DC | PRN

## 2013-07-26 MED ORDER — SODIUM CHLORIDE 0.9 % IJ SOLN: 9.0000 mL | INTRAMUSCULAR | Status: DC | PRN

## 2013-07-26 NOTE — Progress Notes (Signed)
NUTRITION FOLLOW UP  Intervention:   -Recommend Resource Breeze po TID, each supplement provides 250 kcal and 9 grams of protein -Encouraged compliance with bland diet -Discussed intake of soluble fiber to assist in formed stools -Will continue to monitor  Nutrition Dx:   Inadequate oral intake related to abdominal pain, nausea, and vomiting as evidenced by patient report of PO intake and weight loss   Goal:   Pt to meet >/= 90% of their estimated nutrition needs    Monitor:   PO intake and diet advancement, I/Os, weight trends, labs   Assessment:   3/11: Patient with past medical history of testosterone deficiency, anxiety who presented from Dr. Tawanna Sat office with complaints of worsening abdominal pain, nausea and non bloody vomiting. Abdominal pain and nausea has been ongoing for 6 weeks. Pt reported his grandchildren were sick with stomach flu and he has gotten it too and seemed that he never got over it. He described abdominal pain as cramping with intermittent spasms which ger spontaneously resolved Abdominal pain got worse Thursday ( 5 days ago). He only had nausea today prior to this admission. He also had subjective fevers, malaise and poor oral intake, anorexia. His last bowel movement was few days ago. He has never experienced diarrhea.  CT abd showed possible acute infectious colitis or colonic malignancy.  Patient's wife stated that patient has had reoccurring cycles of nausea and abdominal pain since January. Over the weekend patient had worsening symptoms with nausea and vomiting. Patient reported not being able to keep anything down including water.  When patient feels well, patient reports eating 1 meal per day due to work schedule. Patient usually eats a variety of items from [employer]. Patient will also sometimes snack at home on items like oatmeal, nabs, and sandwiches. Patient wife stated that they have been drinking more regular sodas over the past 6 months.  Patient's wife also mentioned that patient drinks coffee based energy drinks daily.  Patient weighted around 280-300 lb 5 years ago, but lost down to 240-245 after being thrown from a horse and sustaining a back injury. Patient lost another 20 lb 2 years ago when he had surgery for back injury. Patient reports that weight has been stable for 2 years.  Patient's wife believes patient has lost around 10 lbs since January. Patient's wife stated that documented weight history is not accurate due to type of scale used (bed) and when patient weighed on standing scale he had on heavy shoes and clothes. Using reported weights, patient with a 5% weight loss in 2 months.  Last BM was 3/6. Patient receiving docusate sodium BID.  Currently NPO for colonoscopy  Glucose elevated at 111, currently trending down  3/18: -Pt s/p subtotal colectomy on 3/16 -Pt tolerating clear liquid diet, has been requesting peach Resource Breeze an average of 3 times/daily. Will order to ensure pt receives supplement. Wife noted that they plan on purchasing supplement for pt to consume when he is stable for d/t -Diet was advanced to Soft on 3/18. Had not tried any solid foods yet, planned on ordering  for lunch meal -Explained rationale and foods compliant with soft diet -Encouraged intake of applesauce, rice, potatoes, bananas to assist with formed stools to incorporate as tolerated -Low Na -Weight continues to decline. Encouraged PO intake and supplement intake  Height: Ht Readings from Last 1 Encounters:  07/18/13 5\' 11"  (1.803 m)    Weight Status:   Wt Readings from Last 1 Encounters:  07/25/13 203  lb 4.2 oz (92.2 kg)  07/19/13 214 lbs  Re-estimated needs:  Kcal: 1900-2100  Protein: 90-100  Fluid: 1.9-2.1 L    Skin: Closed incision on abd  Diet Order: Criss Rosales   Intake/Output Summary (Last 24 hours) at 07/26/13 1122 Last data filed at 07/26/13 0940  Gross per 24 hour  Intake 3105.67 ml  Output   2070 ml   Net 1035.67 ml    Last BM: 3/16   Labs:   Recent Labs Lab 07/21/13 0343 07/25/13 0045 07/26/13 0316  NA 142 135* 136*  K 4.0 4.4 4.4  CL 104 96 100  CO2 27 26 30   BUN 9 7 6   CREATININE 0.99 0.96 1.00  CALCIUM 8.9 8.8 8.5  GLUCOSE 87 172* 113*    CBG (last 3)   Recent Labs  07/24/13 0719  GLUCAP 88    Scheduled Meds: . heparin subcutaneous  5,000 Units Subcutaneous 3 times per day  . HYDROmorphone PCA 0.3 mg/mL   Intravenous 6 times per day  . ketorolac  30 mg Intravenous 4 times per day  . pantoprazole (PROTONIX) IV  40 mg Intravenous Q12H    Continuous Infusions: . dextrose 5% lactated ringers with KCl 20 mEq/L 75 mL/hr at 07/26/13 [place] LDN Clinical Dietitian [phone]

## 2013-07-26 NOTE — Progress Notes (Signed)
Patient seen and examined.  Agree with PA's note. Pathology is pending.

## 2013-07-26 NOTE — Progress Notes (Signed)
2 Days Post-Op  Subjective: Pain is a big issue, Dr. Zella Richer changed to Dilaudid PCA.  He did get up and walk some, no Flatus so far.  Dressing intact  Objective: Vital signs in last 24 hours: Temp:  [98.8 F (37.1 C)-99.2 F (37.3 C)] 99.2 F (37.3 C) (03/18 0530) Pulse Rate:  [101-109] 101 (03/18 0530) Resp:  [12-20] 16 (03/18 0530) BP: (111-129)/(74-92) 129/92 mmHg (03/18 0530) SpO2:  [42 %-100 %] 99 % (03/18 0530) FiO2 (%):  [100 %] 100 % (03/18 1038) Last BM Date: 07/24/13 1080 PO,  Clears => low residual today Drain:  500 ml No BM recorded. Afebrile, VSS, BP is up and HR is up Labs OK  Intake/Output from previous day: 03/17 0701 - 03/18 0700 In: 3983.7 [P.O.:1080; I.V.:2903.7] Out: 2010 [Urine:1500; Drains:510] Intake/Output this shift: Total I/O In: 120 [P.O.:120] Out: 650 [Urine:650]  General appearance: alert, cooperative and no distress Resp: clear to auscultation bilaterally GI: soft tender, decreased BS, no flatus, dressing is dry.  Drain is a serous bloody drainage, not fresh blood.  Lab Results:   Recent Labs  07/25/13 0045 07/26/13 0316  WBC 12.2* 8.7  HGB 13.1 10.5*  HCT 40.0 33.9*  PLT 293 235    BMET  Recent Labs  07/25/13 0045 07/26/13 0316  NA 135* 136*  K 4.4 4.4  CL 96 100  CO2 26 30  GLUCOSE 172* 113*  BUN 7 6  CREATININE 0.96 1.00  CALCIUM 8.8 8.5   PT/INR No results found for this basename: LABPROT, INR,  in the last 72 hours  No results found for this basename: AST, ALT, ALKPHOS, BILITOT, PROT, ALBUMIN,  in the last 168 hours   Lipase  No results found for this basename: lipase     Studies/Results: Dg Chest Port 1 View  07/25/2013   CLINICAL DATA:  Fever.  EXAM: PORTABLE CHEST - 1 VIEW  COMPARISON:  Chest x-ray 01/04/2012.  FINDINGS: Lung volumes are low. No consolidative airspace disease. No pleural effusions. No evidence of pulmonary edema. Heart size is normal. The patient is rotated to the left on today's exam,  resulting in distortion of the mediastinal contours and reduced diagnostic sensitivity and specificity for mediastinal pathology. Orthopedic fixation hardware in the upper lumbar spine.  IMPRESSION: Low lung volumes without radiographic evidence of acute cardiopulmonary disease.   Electronically Signed   By: Vinnie Langton M.D.   On: 07/25/2013 08:06    Medications: . heparin subcutaneous  5,000 Units Subcutaneous 3 times per day  . HYDROmorphone PCA 0.3 mg/mL   Intravenous 6 times per day  . ketorolac  30 mg Intravenous 4 times per day  . pantoprazole (PROTONIX) IV  40 mg Intravenous Q12H   Prior to Admission medications   Medication Sig Start Date End Date Taking? Authorizing Provider  ALPRAZolam Duanne Moron) 0.5 MG tablet Take 1.5 mg by mouth at bedtime.   Yes Historical Provider, MD  oxyCODONE (ROXICODONE) 15 MG immediate release tablet Take 15 mg by mouth every 4 (four) hours as needed for pain.  03/09/13  Yes Historical Provider, MD  promethazine (PHENERGAN) 12.5 MG suppository Place 1 suppository (12.5 mg total) rectally every 6 (six) hours as needed for nausea or vomiting. 07/03/13  Yes Mary-Margaret Hassell Done, FNP  testosterone cypionate (DEPO-TESTOSTERONE) 200 MG/ML injection Inject 1 mL (200 mg total) into the muscle every 14 (fourteen) days. 02/16/13  Yes Chipper Herb, MD     Assessment/Plan 1.  Partially obstructing left colon cancer at  the splenic flexure S/p Laparoscopic assisted near subtotal colectomy with incidental appendectomy.  07/24/2013, [name], MD. 2.  Abnormal TSH 3.  Testosterone deficiency 4.  Anxiety disorder. 5.  Hx of compression vertebral  fracture     Plan:  Dr Zella Richer has seen patient and started him on DIII diet.  Working on mobilizing him also.   LOS: 8 days    Abbe Bula 07/26/2013

## 2013-07-26 NOTE — Progress Notes (Signed)
[facility] PROGRESS NOTE  Luke Miller JQZ:[id number] DOB: [birth year] DOA: 07/18/2013 PCP: [name], MD  Brief narrative: 48 y.o. male with a PMH of testosterone deficiency and anxiety who presented from his PCP office 07/18/13 with a six-week history of worsening abdominal pain, nausea and nonbloody vomiting. He has also lost approximately 10 pounds since his illness began. A CT scan done on admission showed thickening of the splenic flexure.   Assessment/Plan:   Principal Problem:  Partially obstructing left colon cancer  - s/p near subtotal colectomy 07/24/13, post op day #2 - pain control with PCA - appreciate surgery following; plan to advance the diet and ambulate today - oncology to see the pt prior to discharge to set up outpt follow up; did not ask for official consult   Active Problems:  Leukocytosis with fever  - possibly post op related  - no further febrile episodes; antibiotics on hold - blood cultures so far showed no growth to date and CXR did not reveal acute cardiopulmonary findings - White blood cell count is within normal limits this morning Low TSH  - Free T4 within normal limits.  Testosterone deficiency  - Continue testosterone hormone replacement every 2 weeks.  Gen. anxiety disorder  - Continue Xanax each bedtime.  DVT Prophylaxis  Continue SCDs.   Code Status: Full.  Family Communication: Wife updated at the bedside  Disposition Plan: Home when stable.   IV access:  Peripheral IV Medical Consultants:  Dr. Arta Silence, Gastroenterology  Dr. Ralene Ok and Dr. Jackolyn Confer, Surgery Oncology - will see the pt prior to discharge to set up  outpt follow up; not an official consult  Other Consultants:  None Anti-infectives:  Cipro 07/18/13--->07/24/13  Flagyl 07/18/13--->07/24/13   Leisa Lenz, MD  [facility] Pager [phone]  If 7PM-7AM, please contact night-coverage [email or web address] Password [email or web address] 07/26/2013, 12:02 PM   LOS: 8 days    HPI/Subjective: No acute overnight events.   Objective: Filed Vitals:   07/25/13 2332 07/26/13 0457 07/26/13 0500 07/26/13 0530  BP:    129/92  Pulse:    101  Temp:    99.2 F (37.3 C)  TempSrc:    Oral  Resp: 15 16 16 16   Height:      Weight:      SpO2: 100% 97% 97% 99%    Intake/Output Summary (Last 24 hours) at 07/26/13 1202 Last data filed at 07/26/13 0940  Gross per 24 hour  Intake 3105.67 ml  Output   2070 ml  Net 1035.67 ml    Exam:   General:  Pt is alert, follows commands appropriately, not in acute distress  Cardiovascular: Regular rate and rhythm, S1/S2 appreciated   Respiratory: Clear to auscultation bilaterally, no wheezing, no crackles, no rhonchi  Abdomen: Soft, non tender, non distended, bowel sounds present, drain in place (serous bloody)  Extremities: No edema, pulses DP and PT palpable bilaterally  Neuro: Grossly nonfocal  Data Reviewed: Basic Metabolic Panel:  Recent Labs Lab 07/21/13 0343 07/25/13 0045 07/26/13 0316  NA 142 135* 136*  K 4.0 4.4 4.4  CL 104 96 100  CO2 27 26 30   GLUCOSE 87 172* 113*  BUN 9 7 6   CREATININE 0.99 0.96 1.00  CALCIUM 8.9 8.8 8.5   Liver Function Tests: No results found for this basename: AST, ALT, ALKPHOS, BILITOT, PROT, ALBUMIN,  in the last 168 hours No results found for this basename: LIPASE, AMYLASE,  in the last 168 hours No results found for this  basename: AMMONIA,  in the last 168 hours CBC:  Recent Labs Lab 07/21/13 0343 07/25/13 0045 07/26/13 0316  WBC 4.0 12.2* 8.7  HGB 11.8* 13.1 10.5*  HCT 37.1* 40.0 33.9*  MCV 76.2* 74.3* 76.9*  PLT 269 293 235   Cardiac Enzymes: No results found for this basename: CKTOTAL, CKMB, CKMBINDEX, TROPONINI,  in the last 168 hours BNP: No components found with this basename: POCBNP,  CBG:  Recent Labs Lab 07/20/13 0803 07/21/13 0800 07/22/13 0524 07/23/13 0729 07/24/13 0719  GLUCAP 99 90 85 120* 88    Recent Results (from  the past 240 hour(s))  MRSA PCR SCREENING     Status: None   Collection Time    07/20/13  5:00 PM      Result Value Ref Range Status   MRSA by PCR NEGATIVE  NEGATIVE Final   Comment:            The GeneXpert MRSA Assay (FDA     approved for NASAL specimens     only), is one component of a     comprehensive MRSA colonization     surveillance program. It is not     intended to diagnose MRSA     infection nor to guide or     monitor treatment for     MRSA infections.  SURGICAL PCR SCREEN     Status: None   Collection Time    07/23/13  8:42 PM      Result Value Ref Range Status   MRSA, PCR NEGATIVE  NEGATIVE Final   Staphylococcus aureus NEGATIVE  NEGATIVE Final   Comment:            The Xpert SA Assay (FDA     approved for NASAL specimens     in patients over 49 years of age),     is one component of     a comprehensive surveillance     program.  Test performance has     been validated by [facility] for patients greater     than or equal to 49 year old.     It is not intended     to diagnose infection nor to     guide or monitor treatment.  CULTURE, BLOOD (ROUTINE X 2)     Status: None   Collection Time    07/25/13 12:45 AM      Result Value Ref Range Status   Specimen Description BLOOD RIGHT ANTECUBITAL   Final   Special Requests BOTTLES DRAWN AEROBIC AND ANAEROBIC 5CC   Final   Culture  Setup Time     Final   Value: 07/25/2013 04:35     Performed at [facility]   Culture     Final   Value:        BLOOD CULTURE RECEIVED NO GROWTH TO DATE CULTURE WILL BE HELD FOR 5 DAYS BEFORE ISSUING A FINAL NEGATIVE REPORT     Performed at [facility]   Report Status PENDING   Incomplete  CULTURE, BLOOD (ROUTINE X 2)     Status: None   Collection Time    07/25/13 12:50 AM      Result Value Ref Range Status   Specimen Description BLOOD RIGHT HAND   Final   Special Requests BOTTLES DRAWN AEROBIC ONLY 2CC   Final   Culture  Setup Time     Final   Value:  07/25/2013 04:36  Performed at [facility]     Final   Value:        BLOOD CULTURE RECEIVED NO GROWTH TO DATE CULTURE WILL BE HELD FOR 5 DAYS BEFORE ISSUING A FINAL NEGATIVE REPORT     Performed at [facility]   Report Status PENDING   Incomplete     Studies: Dg Chest Port 1 View 07/25/2013  IMPRESSION: Low lung volumes without radiographic evidence of acute cardiopulmonary disease.   Scheduled Meds: . heparin subcutaneous  5,000 Units Subcutaneous 3 times per day  . HYDROmorphone PCA 0.3 mg/mL   Intravenous 6 times per day  . ketorolac  30 mg Intravenous 4 times per day  . pantoprazole (PROTONIX) IV  40 mg Intravenous Q12H   Continuous Infusions: . dextrose 5% lactated ringers with KCl 20 mEq/L 75 mL/hr at 07/26/13 1046

## 2013-07-27 LAB — CBC
HEMATOCRIT: 33.8 % — AB (ref 39.0–52.0)
Hemoglobin: 10.5 g/dL — ABNORMAL LOW (ref 13.0–17.0)
MCH: 23.8 pg — ABNORMAL LOW (ref 26.0–34.0)
MCHC: 31.1 g/dL (ref 30.0–36.0)
MCV: 76.5 fL — ABNORMAL LOW (ref 78.0–100.0)
Platelets: 280 10*3/uL (ref 150–400)
RBC: 4.42 MIL/uL (ref 4.22–5.81)
RDW: 14.7 % (ref 11.5–15.5)
WBC: 8.4 10*3/uL (ref 4.0–10.5)

## 2013-07-27 MED ORDER — SODIUM CHLORIDE 0.9 % IV SOLN
25.0000 mg | Freq: Three times a day (TID) | INTRAVENOUS | Status: DC | PRN
  Administered 2013-07-27: 25 mg via INTRAVENOUS
  Filled 2013-07-27: qty 1

## 2013-07-27 MED ORDER — ALPRAZOLAM ER 1 MG PO TB24: 1.5000 mg | ORAL_TABLET | Freq: Every day | ORAL | Status: DC

## 2013-07-27 MED ORDER — MORPHINE SULFATE 2 MG/ML IJ SOLN
2.0000 mg | INTRAMUSCULAR | Status: DC | PRN
  Administered 2013-07-27: 3 mg via INTRAVENOUS
  Filled 2013-07-27: qty 2

## 2013-07-27 MED ORDER — KETOROLAC TROMETHAMINE 30 MG/ML IJ SOLN
30.0000 mg | Freq: Four times a day (QID) | INTRAMUSCULAR | Status: AC
  Administered 2013-07-27 – 2013-07-28 (×6): 30 mg via INTRAVENOUS
  Filled 2013-07-27 (×4): qty 1
  Filled 2013-07-27: qty 2
  Filled 2013-07-27 (×3): qty 1

## 2013-07-27 MED ORDER — OXYCODONE-ACETAMINOPHEN 5-325 MG PO TABS
1.0000 | ORAL_TABLET | ORAL | Status: DC | PRN
  Administered 2013-07-28 (×2): 1 via ORAL
  Administered 2013-07-28: 2 via ORAL
  Administered 2013-07-28: 1 via ORAL
  Administered 2013-07-29 (×2): 2 via ORAL
  Filled 2013-07-27: qty 2
  Filled 2013-07-27: qty 1
  Filled 2013-07-27 (×3): qty 2
  Filled 2013-07-27: qty 1

## 2013-07-27 MED ORDER — ALPRAZOLAM 1 MG PO TABS
1.5000 mg | ORAL_TABLET | Freq: Three times a day (TID) | ORAL | Status: DC | PRN
  Administered 2013-07-27 – 2013-07-28 (×3): 1.5 mg via ORAL
  Filled 2013-07-27 (×4): qty 1

## 2013-07-27 MED ORDER — MORPHINE SULFATE 2 MG/ML IJ SOLN
2.0000 mg | INTRAMUSCULAR | Status: DC | PRN
  Administered 2013-07-27: 4 mg via INTRAVENOUS
  Administered 2013-07-28 (×2): 2 mg via INTRAVENOUS
  Filled 2013-07-27: qty 2
  Filled 2013-07-27 (×3): qty 1

## 2013-07-27 MED ORDER — ACETAMINOPHEN 325 MG PO TABS: 650.0000 mg | ORAL_TABLET | Freq: Four times a day (QID) | ORAL | Status: DC | PRN

## 2013-07-27 MED ORDER — ALPRAZOLAM 0.5 MG PO TABS
1.5000 mg | ORAL_TABLET | Freq: Every day | ORAL | Status: DC
  Filled 2013-07-27 (×2): qty 1

## 2013-07-27 MED ORDER — PANTOPRAZOLE SODIUM 40 MG PO TBEC
40.0000 mg | DELAYED_RELEASE_TABLET | Freq: Two times a day (BID) | ORAL | Status: DC
  Administered 2013-07-27 – 2013-07-29 (×4): 40 mg via ORAL
  Filled 2013-07-27 (×6): qty 1

## 2013-07-27 NOTE — Progress Notes (Signed)
Patient woke up from a 4 hour nap and is extremely confused.  He is saying "19!" and was walking in the hallway looking for the restroom.  He did not know where he was at at the time or why he was in the hospital.  After about 15 minutes, patient was able to say his name, birthday, that he had surgery, and that "I'm dying."  Dr. Charlies Silvers paged to notify her of patient's condition.  Will continue to monitor patient and reorient his as needed.

## 2013-07-27 NOTE — Progress Notes (Signed)
3 Days Post-Op  Subjective: He had a horrible night, with pain, nausea he attributed to the dilaudid, apparently no one called to tell us there was an issue. He is off PCA, on MS, I have reordered the Toradol.  He had allot of burping, one small loose stooling last PM, some flatus this Am.  He still had some nausea this Am, it was better after Zofran. Objective: Vital signs in last 24 hours: Temp:  [98.5 F (36.9 C)-98.8 F (37.1 C)] 98.8 F (37.1 C) (03/19 0750) Pulse Rate:  [82-92] 82 (03/19 0750) Resp:  [16-18] 16 (03/19 0750) BP: (126-149)/(76-90) 149/88 mmHg (03/19 0750) SpO2:  [98 %-100 %] 99 % (03/19 0750) FiO2 (%):  [98 %] 98 % (03/18 1235) Last BM Date: 07/27/13 580 Po recorded Drain:  295 ml Afebrile, BP up some WBC is normal.  Intake/Output from previous day: 03/18 0701 - 03/19 0700 In: 897.5 [P.O.:580; I.V.:317.5] Out: 2120 [Urine:1825; Drains:295] Intake/Output this shift: Total I/O In: -  Out: 380 [Urine:300; Drains:80]  General appearance: alert, cooperative and he just looks worn out. Resp: clear to auscultation bilaterally GI: BS are hyper active, wounds are fine, I cleaned and redressed.  drain is serosanguinous colored drainage.  Lab Results:   Recent Labs  07/26/13 0316 07/27/13 0320  WBC 8.7 8.4  HGB 10.5* 10.5*  HCT 33.9* 33.8*  PLT 235 280    BMET  Recent Labs  07/25/13 0045 07/26/13 0316  NA 135* 136*  K 4.4 4.4  CL 96 100  CO2 26 30  GLUCOSE 172* 113*  BUN 7 6  CREATININE 0.96 1.00  CALCIUM 8.8 8.5   PT/INR No results found for this basename: LABPROT, INR,  in the last 72 hours  No results found for this basename: AST, ALT, ALKPHOS, BILITOT, PROT, ALBUMIN,  in the last 168 hours   Lipase  No results found for this basename: lipase     Studies/Results: No results found.  Medications: . feeding supplement (RESOURCE BREEZE)  1 Container Oral TID BM  . heparin subcutaneous  5,000 Units Subcutaneous 3 times per day  .  pantoprazole  40 mg Oral BID AC   . dextrose 5% lactated ringers with KCl 20 mEq/L 75 mL/hr at 07/27/13 0516   Prior to Admission medications   Medication Sig Start Date End Date Taking? Authorizing Provider  ALPRAZolam Duanne Moron) 0.5 MG tablet Take 1.5 mg by mouth at bedtime.   Yes Historical Provider, MD  oxyCODONE (ROXICODONE) 15 MG immediate release tablet Take 15 mg by mouth every 4 (four) hours as needed for pain.  03/09/13  Yes Historical Provider, MD  promethazine (PHENERGAN) 12.5 MG suppository Place 1 suppository (12.5 mg total) rectally every 6 (six) hours as needed for nausea or vomiting. 07/03/13  Yes Mary-Margaret Hassell Done, FNP  testosterone cypionate (DEPO-TESTOSTERONE) 200 MG/ML injection Inject 1 mL (200 mg total) into the muscle every 14 (fourteen) days. 02/16/13  Yes Chipper Herb, MD     Assessment/Plan 1. Partially obstructing left colon cancer at the splenic flexure  S/p Laparoscopic assisted near subtotal colectomy with incidental appendectomy. 07/24/2013, [name], MD.  2. Abnormal TSH  3. Testosterone deficiency  4. Anxiety disorder.  5. Hx of compression vertebral fracture   Plan:  I told him to go easy on PO's, increased his MS, added Toradol, He has been walking. He would like to resume his home  Xanax.  i put his IV rate up while he's feeling bad, and also  added some PO pain medicines.  LOS: 9 days    Henri Guedes 07/27/2013

## 2013-07-27 NOTE — Progress Notes (Signed)
The patient is receiving Protonix by the intravenous route.  Based on criteria approved by the Pharmacy and [place], the medication is being converted to the equivalent oral dose form.  These criteria include: -No Active GI bleeding -Able to tolerate diet of full liquids (or better) or tube feeding -Able to tolerate other medications by the oral or enteral route  If you have any questions about this conversion, please contact the Pharmacy Department (phone [phone]).  Thank you.  Zareen Jamison, Gaye Alken PharmD Pager #: [phone] 7:19 AM 07/27/2013

## 2013-07-27 NOTE — Progress Notes (Signed)
FINAL for Miller, Miller ([id number]) Patient: Luke Miller, Luke Miller Collected: 07/24/2013 Client: [facility] Accession: [id number] Received: 07/24/2013 Luke Hollingshead, MD DOB: Apr 01, 1966 Age: 48 Gender: M Reported: 07/26/2013 [address] Patient Ph: [phone] MRN #: [id number] [name], [name] [address] Visit #: [id number] Chart #: Phone: [phone] Fax: CC: REPORT OF SURGICAL PATHOLOGY FINAL DIAGNOSIS Diagnosis 1. Colon, segmental resection for tumor, extended left - INVASIVE ADENOCARCINOMA WITH ABUNDANT EXTRACELLULAR MUCIN, INVADING THROUGH THE MUSCULARIS PROPRIA INTO PERICOLONIC FATTY TISSUE. - TWENTY-THREE LYMPH NODES, NEGATIVE FOR METASTATIC CARCINOMA (0/23). - HYPERPLASTIC POLYPS WITH NO EVIDENCE OF ADENOMATOUS CHANGES OR MALIGNANCY. - RESECTION MARGINS, NEGATIVE FOR ATYPIA OR MALIGNANCY. 2. Colon, segmental resection, transverse and part of right with appendix - BENIGN COLONIC MUCOSA. - BENIGN APPENDICEAL TISSUE. - NO EVIDENCE OF ACTIVE INFLAMMATION OR NEOPLASM. - RESECTION MARGINS, NEGATIVE FOR ATYPIA OR MALIGNANCY. Microscopic Comment 1. 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 Microscopic extension of invasive tumor: Invading through the muscularis propria into pericolonic fatty tissue Lymph-Vascular invasion: Not identified Peri-neural invasion: Not identified Tumor deposit(s) (discontinuous extramural extension): Present, 2 Resection margins: Negative Proximal margin:  8.5 cm Distal margin: 38.5 cm 1 of 3 FINAL for Luke Miller ([id number]) Microscopic Comment(continued) Circumferential (radial) (posterior ascending, posterior descending; lateral and posterior mid-rectum; and entire lower 1/3 rectum): 2.5 cm Treatment effect (neo-adjuvant therapy): No Additional polyp(s): Multiple hyperplastic polyps with no evidence of adenomatous changes or malignancy. Non-neoplastic findings: Prominent reaction at the periphery of the tumor Lymph nodes: number examined 23; number positive: 0 Pathologic Staging: pT3, pN1c, pMX Ancillary studies: MMR testing and microsatellite instability test will be performed and an addendum report will follow. (HCL:kh 07/26/13) Luke Bar MD Pathologist, Electronic Signature (Case signed 07/26/2013) Specimen Gross and Clinical Information Specimen(s) Obtained: 1. Colon, segmental resection for tumor, extended left 2. Colon, segmental resection, transverse and part of right with appendix Specimen Clinical Information 1. colon cancer (kp) Gross 1. Specimen: Received in formalin labeled extended left colectomy Specimen integrity: Intact, with two stapled resection lines. A suture marks the rectosigmoid junction. Specimen length: 54.2 cm Mesorectal intactness: N/A Tumor location: Within the proximal portion of the specimen (per physician, splenic flexure) Tumor size: There is a 7.5 x 6.5 x 3.3 cm circumferential tan brown, polypoid, exophytic mass Percent of bowel circumference involved: 100% Tumor distance to margins: Proximal: 8.5 cm Distal: 38.5 cm Radial (posterior ascending, posterior descending; lateral and posterior mid-rectum; and entire lower 1/3 rectum): 2.5 cm Macroscopic extent of tumor invasion: Tumor invades through the muscularis propria into the subserosal adipose tissue. Total presumed lymph nodes: Eighteen possible lymph nodes are identified within the main colon specimen, ranging from 0.2 to 1.1 cm in  greatest dimensions. Extramural satellite tumor nodules: In the adipose tissue adjacent to the tumor mass, there are two tan white, firm, glistening nodules identified measuring 0.7 and 1.0 cm in greatest dimensions. Mucosal polyp(s): At the distal aspect of the specimen, there are three sessile tan polyps identified, ranging from 0.2 to 0.3 cm in greatest dimension. Additional findings: Also received in the same container is an unoriented additional segment of colon with two stapled resection margin, measuring 16.7 cm in  length. The serosa is tan purple and smooth, with attached yellow red adipose tissue. The lumen contains an abundant amount of green liquid fecal material. The mucosa is tan pink with normal folding, and two tan brown sessile polyps are identified, each measuring 0.3 cm in greatest dimension. There is a 5.6 cm in length area of red brown mucosal disruption. The disruption measures 3.0 cm to the closest resection margin. The wall measures 0.2 cm in thickness. Five possible lymph nodes are identified in this additional segment of colon, ranging from 0.3 to 0.5 cm in greatest dimension. Block summary: A, B = opposite resection margins of additional segment of colon 2 of 3 FINAL for Luke Miller ([id number]) Gross(continued) C = polyps from additional segment of colon D = area of mucosal disruption from separate segment of colon. E = representative mucosa from separate segment of colon. F = three possible lymph nodes from separate segment of colon. H - proximal resection margin, main segment of colon I = distal resection margin, main segment of colon J-P = mass Q, R = mucosal polyps S = representative sections of uninvolved mucosa T = possible extramural tumor nodules U = four possible lymph nodes V = four possible lymph nodes W = four possible lymph nodes X = three possible lymph nodes Y = two possible lymph nodes, differentially inked and bisected Z = one possible  lymph node, bisected. 2. Specimen: Received in formalin labeled remaining transverse colon and part of right colon. A suture designates the proximal aspect. Length: 8.0 cm Serosa: Tan pink and smooth, with attached adipose tissue. Contents: The lumen contains a moderate amount of brown green fecal material. Mucosa/Wall: Tan pink with normal folding. No mucosal lesions are grossly identified. The wall measures 0.2 cm in thickness. Additional findings: The appendix is also received in the same container and consists of a 4.6 cm in length x 0.7 cm in diameter vermiform appendix. The serosa is tan pink and smooth. The mucosa is tan, the wall measures 0.2 cm in thickness, and the lumen is grossly unremarkable. Lymph nodes: None identified Block Summary: Five blocks submitted A = proximal resection margin B = distal resection margin C, D = representative sections of mucosa E = appendix (KL:kh 07-25-13) Stain(s) used in Diagnosis: The following stain(s) were used in diagnosing the case: MSH6, MLH1, MSH2, PMS2. The control(s) stained appropriately. Disclaimer Some of these immunohistochemical stains may have been developed and the performance characteristics determined by [facility]. Some may not have been cleared or approved by the U.S. Food and Drug Administration. The FDA has determined that such clearance or approval is not necessary. This test is used for clinical purposes. It should not be regarded as investigational or for research. This laboratory is certified under the [place] (CLIA-88) as qualified to perform high complexity clinical laboratory testing. Report signed out from the following location(s) Technical Component and Interpretation performed at [address], [place], [place] [address]. CLIA #: [id number], 3 of 3

## 2013-07-27 NOTE — Progress Notes (Addendum)
[facility] PROGRESS NOTE  [name] CHE:[id number] DOB: [birth year] DOA: 07/18/2013 PCP: [name], MD  Brief narrative: 48 y.o. male with a PMH of testosterone deficiency and anxiety who presented from his PCP office 07/18/13 with a six-week history of worsening abdominal pain, nausea and nonbloody vomiting. He has also lost approximately 10 pounds since his illness began. A CT scan done on admission showed thickening of the splenic flexure. Pt was ultimately found to have invasive colonic adenocarcinoma status post subtotal colectomy on 07/24/2013 with negative resection margins based on pathology report.  Assessment/Plan:   Principal Problem:  Partially obstructing left colon cancer  - s/p near subtotal colectomy 07/24/13 - Reports having small bowel movement this morning - Pain regimen is with morphine 2-3 mg every 2 hours IV as needed for severe pain. We continued Toradol 30 mg IV every 6 hours. - appreciate surgery following; diet as tolerates - oncology to see the pt prior to discharge to set up outpt follow up; did not ask for official consult; I have shared the pathology results with the family this morning Active Problems:  Leukocytosis with fever  - possibly post op related  - no further febrile episodes; antibiotics on hold  - blood cultures so far showed no growth to date and CXR did not reveal acute cardiopulmonary findings  - White blood cell count is within normal limits Low TSH  - Free T4 within normal limits.  Testosterone deficiency  - Continue testosterone hormone replacement every 2 weeks.  Gen. anxiety disorder  - Continue Xanax each bedtime.  DVT Prophylaxis  Continue SCDs.   Code Status: Full.  Family Communication: Wife updated at the bedside  Disposition Plan: Home when stable.   IV access:  Peripheral IV Medical Consultants:  Dr. Arta Silence, Gastroenterology  Dr. Ralene Ok and Dr. Jackolyn Confer, Surgery  Oncology - will see the  pt prior to discharge to set up outpt follow up; not an official consult  Other Consultants:  None Anti-infectives:  Cipro 07/18/13--->07/24/13  Flagyl 07/18/13--->07/24/13  Leisa Lenz, MD  [facility] Pager [phone]  If 7PM-7AM, please contact night-coverage [email or web address] Password [facility] 07/27/2013, 11:23 AM   LOS: 9 days    HPI/Subjective: Overnight had nausea, vomiting, feeling miserable this morning. Did not tolerate Dilaudid.  Objective: Filed Vitals:   07/27/13 0018 07/27/13 0400 07/27/13 0722 07/27/13 0750  BP:    149/88  Pulse:    82  Temp:    98.8 F (37.1 C)  TempSrc:    Oral  Resp: 16 16 18 16   Height:      Weight:      SpO2: 98% 98% 100% 99%    Intake/Output Summary (Last 24 hours) at 07/27/13 1123 Last data filed at 07/27/13 0751  Gross per 24 hour  Intake  777.5 ml  Output   1750 ml  Net -972.5 ml    Exam:   General:  Pt is alert, follows commands appropriately, not in acute distress  Cardiovascular: Regular rate and rhythm, S1/S2 appreciated   Respiratory: Clear to auscultation bilaterally, no wheezing, no crackles, no rhonchi  Abdomen: Soft, non tender, non distended, bowel sounds present, drain in place   Extremities: No edema, pulses DP and PT palpable bilaterally  Neuro: Grossly nonfocal  Data Reviewed: Basic Metabolic Panel:  Recent Labs Lab 07/21/13 0343 07/25/13 0045 07/26/13 0316  NA 142 135* 136*  K 4.0 4.4 4.4  CL 104 96 100  CO2 27 26 30   GLUCOSE 87 172*  113*  BUN 9 7 6   CREATININE 0.99 0.96 1.00  CALCIUM 8.9 8.8 8.5   Liver Function Tests: No results found for this basename: AST, ALT, ALKPHOS, BILITOT, PROT, ALBUMIN,  in the last 168 hours No results found for this basename: LIPASE, AMYLASE,  in the last 168 hours No results found for this basename: AMMONIA,  in the last 168 hours CBC:  Recent Labs Lab 07/21/13 0343 07/25/13 0045 07/26/13 0316 07/27/13 0320  WBC 4.0 12.2* 8.7 8.4  HGB 11.8* 13.1  10.5* 10.5*  HCT 37.1* 40.0 33.9* 33.8*  MCV 76.2* 74.3* 76.9* 76.5*  PLT 269 293 235 280   Cardiac Enzymes: No results found for this basename: CKTOTAL, CKMB, CKMBINDEX, TROPONINI,  in the last 168 hours BNP: No components found with this basename: POCBNP,  CBG:  Recent Labs Lab 07/21/13 0800 07/22/13 0524 07/23/13 0729 07/24/13 0719  GLUCAP 90 85 120* 88    Recent Results (from the past 240 hour(s))  MRSA PCR SCREENING     Status: None   Collection Time    07/20/13  5:00 PM      Result Value Ref Range Status   MRSA by PCR NEGATIVE  NEGATIVE Final   Comment:            The GeneXpert MRSA Assay (FDA     approved for NASAL specimens     only), is one component of a     comprehensive MRSA colonization     surveillance program. It is not     intended to diagnose MRSA     infection nor to guide or     monitor treatment for     MRSA infections.  SURGICAL PCR SCREEN     Status: None   Collection Time    07/23/13  8:42 PM      Result Value Ref Range Status   MRSA, PCR NEGATIVE  NEGATIVE Final   Staphylococcus aureus NEGATIVE  NEGATIVE Final   Comment:            The Xpert SA Assay (FDA     approved for NASAL specimens     in patients over 34 years of age),     is one component of     a comprehensive surveillance     program.  Test performance has     been validated by [employer] for patients greater     than or equal to 45 year old.     It is not intended     to diagnose infection nor to     guide or monitor treatment.  CULTURE, BLOOD (ROUTINE X 2)     Status: None   Collection Time    07/25/13 12:45 AM      Result Value Ref Range Status   Specimen Description BLOOD RIGHT ANTECUBITAL   Final   Special Requests BOTTLES DRAWN AEROBIC AND ANAEROBIC 5CC   Final   Culture  Setup Time     Final   Value: 07/25/2013 04:35     Performed at [facility]   Culture     Final   Value:        BLOOD CULTURE RECEIVED NO GROWTH TO DATE CULTURE WILL BE HELD FOR 5  DAYS BEFORE ISSUING A FINAL NEGATIVE REPORT     Performed at [facility]   Report Status PENDING   Incomplete  CULTURE, BLOOD (ROUTINE X 2)     Status: None  Collection Time    07/25/13 12:50 AM      Result Value Ref Range Status   Specimen Description BLOOD RIGHT HAND   Final   Special Requests BOTTLES DRAWN AEROBIC ONLY 2CC   Final   Culture  Setup Time     Final   Value: 07/25/2013 04:36     Performed at [facility]   Culture     Final   Value:        BLOOD CULTURE RECEIVED NO GROWTH TO DATE CULTURE WILL BE HELD FOR 5 DAYS BEFORE ISSUING A FINAL NEGATIVE REPORT     Performed at [facility]   Report Status PENDING   Incomplete     Studies: No results found.  Scheduled Meds: . ALPRAZolam  1.5 mg Oral QHS  . feeding supplement (RESOURCE BREEZE)  1 Container Oral TID BM  . heparin subcutaneous  5,000 Units Subcutaneous 3 times per day  . ketorolac  30 mg Intravenous 4 times per day  . pantoprazole  40 mg Oral BID AC   Continuous Infusions: . dextrose 5% lactated ringers with KCl 20 mEq/L 100 mL/hr at 07/27/13 1115

## 2013-07-27 NOTE — Progress Notes (Signed)
Patient woke up and had 1 bout of brown, foul smelling emesis.  Patient reported that he felt much better after the fact.  PO zofran given as patient did not have IV access at the time.  MD notified.  Patient is now much more clear in his thoughts.  He knows where he is, why he is here, and his birthdate.  Patient is still very drowsy and easily drifts back off to sleep.

## 2013-07-27 NOTE — Progress Notes (Signed)
Patient seen and examined.  Events of last night noted and some changes made.  Pathology is T3N1c based on two satellite nodules positive for tumor.  0/23 LNs involved.

## 2013-07-28 MED ORDER — MORPHINE SULFATE 2 MG/ML IJ SOLN: 2.0000 mg | INTRAMUSCULAR | Status: DC | PRN

## 2013-07-28 NOTE — Progress Notes (Signed)
Medical oncology  I introduced myself to Luke Miller and his wife and was going to facilitate an appropriate follow up in our office for consideration of adjuvant chemotherapy in the setting of his pT3N1cMx colon adenocarcinoma.  He and his wife request local follow up with Dr. Marko Stai. Darovsky, MD (Hematology/Oncology) of [place] [phone]. [address], [place], [place] [address].  His office can be reached at [phone].   They will call to arrange an appointment in 2-3 weeks.  Typically we start adjuvant chemotherapy 4-6 weeks status post surgery.  We will assist with any requests regarding his care.

## 2013-07-28 NOTE — Discharge Instructions (Signed)
[facility], [place] [phone]  OPEN ABDOMINAL SURGERY: POST OP INSTRUCTIONS  Always review your discharge instruction sheet given to you by the facility where your surgery was performed.  IF YOU HAVE DISABILITY OR FAMILY LEAVE FORMS, YOU MUST BRING THEM TO THE OFFICE FOR PROCESSING.  PLEASE DO NOT GIVE THEM TO YOUR DOCTOR.  1. A prescription for pain medication may be given to you upon discharge.  Take your pain medication as prescribed, if needed.  If narcotic pain medicine is not needed, then you may take acetaminophen (Tylenol) or ibuprofen (Advil) as needed. 2. Take your usually prescribed medications unless otherwise directed. 3. If you need a refill on your pain medication, please contact your pharmacy. They will contact our office to request authorization.  Prescriptions will not be filled after 5pm or on week-ends. 4. You should eat a lowfat diet and drink plenty of fluids. 5. Most patients will experience some swelling and bruising in the area of the incision. Ice pack will help. Swelling and bruising can take several days to resolve..  6. It is common to experience some constipation if taking pain medication after surgery.  Increasing fluid intake and taking a stool softener will usually help or prevent this problem from occurring.  A mild laxative (Milk of Magnesia or Miralax) should be taken according to package directions if there are no bowel movements after 48 hours. 7.  You may have steri-strips (small skin tapes) in place directly over the incision.  These strips should be left on the skin for 7-10 days.  If your surgeon used skin glue on the incision, you may shower in 24 hours.  The glue will flake off over the next 2-3 weeks.  Any sutures or staples will be removed at the office during your follow-up visit. You may find that a light gauze bandage over your incision may keep your staples from being rubbed or pulled. You may shower and replace the bandage  daily. 8. ACTIVITIES:  You may resume regular (light) daily activities beginning the next day--such as daily self-care, walking, climbing stairs--gradually increasing activities as tolerated.  You may have sexual intercourse when it is comfortable.  Refrain from any heavy lifting or straining-nothing over 10 pounds for 6 weeks  a. You may drive when you no longer are taking prescription pain medication, you can comfortably wear a seatbelt, and you can safely maneuver your car and apply brakes b. Return to Work: _When released by MD__________________________________ 61. You should see your doctor in the office for a follow-up appointment approximately two weeks after your surgery.  Make sure that you call for this appointment within a day or two after you arrive home to insure a convenient appointment time. OTHER INSTRUCTIONS:  _____________________________________________________________ _____________________________________________________________  WHEN TO CALL YOUR DOCTOR: 1. Fever over 101.0 2. Inability to urinate 3. Nausea and/or vomiting 4. Extreme swelling or bruising 5. Continued bleeding from incision. 6. Increased pain, redness, or drainage from the incision..  The clinic staff is available to answer your questions during regular business hours.  Please dont hesitate to call and ask to speak to one of the nurses if you have concerns.  For further questions, please visit [email or web address]

## 2013-07-28 NOTE — Progress Notes (Signed)
4 Days Post-Op  Subjective: Had some hallucinations late yesterday after receiving a fair amount of Morphine.  Feels much better today.  One episode of small volume vomiting around that time as well.  Passing gas.  Objective: Vital signs in last 24 hours: Temp:  [98 F (36.7 C)-98.8 F (37.1 C)] 98 F (36.7 C) (03/20 0548) Pulse Rate:  [74-100] 75 (03/20 0548) Resp:  [18-20] 20 (03/20 0548) BP: (124-178)/(79-101) 124/79 mmHg (03/20 0548) SpO2:  [98 %-100 %] 98 % (03/20 0548) Last BM Date: 07/27/13  Intake/Output from previous day: 03/19 0701 - 03/20 0700 In: 950 [P.O.:125; I.V.:800; IV Piggyback:25] Out: 1130 [Urine:800; Drains:330] Intake/Output this shift:    PE: General- In NAD Abdomen-soft, incisions are clean and intact, thin serous drain output  Lab Results:   Recent Labs  07/26/13 0316 07/27/13 0320  WBC 8.7 8.4  HGB 10.5* 10.5*  HCT 33.9* 33.8*  PLT 235 280   BMET  Recent Labs  07/26/13 0316  NA 136*  K 4.4  CL 100  CO2 30  GLUCOSE 113*  BUN 6  CREATININE 1.00  CALCIUM 8.5   PT/INR No results found for this basename: LABPROT, INR,  in the last 72 hours Comprehensive Metabolic Panel:    Component Value Date/Time   NA 136* 07/26/2013 0316   NA 135* 07/25/2013 0045   K 4.4 07/26/2013 0316   K 4.4 07/25/2013 0045   CL 100 07/26/2013 0316   CL 96 07/25/2013 0045   CO2 30 07/26/2013 0316   CO2 26 07/25/2013 0045   BUN 6 07/26/2013 0316   BUN 7 07/25/2013 0045   CREATININE 1.00 07/26/2013 0316   CREATININE 0.96 07/25/2013 0045   GLUCOSE 113* 07/26/2013 0316   GLUCOSE 172* 07/25/2013 0045   CALCIUM 8.5 07/26/2013 0316   CALCIUM 8.8 07/25/2013 0045   AST 8 07/19/2013 0338   AST 10 07/18/2013 1940   ALT 6 07/19/2013 0338   ALT 7 07/18/2013 1940   ALKPHOS 66 07/19/2013 0338   ALKPHOS 76 07/18/2013 1940   BILITOT 0.3 07/19/2013 0338   BILITOT 0.3 07/18/2013 1940   PROT 6.8 07/19/2013 0338   PROT 7.7 07/18/2013 1940   ALBUMIN 2.9* 07/19/2013 0338   ALBUMIN 3.3*  07/18/2013 1940     Studies/Results: No results found.  Anti-infectives: Anti-infectives   Start     Dose/Rate Route Frequency Ordered Stop   07/24/13 2200  cefoTEtan (CEFOTAN) 2 g in dextrose 5 % 50 mL IVPB     2 g 100 mL/hr over 30 Minutes Intravenous Every 12 hours 07/24/13 1605 07/24/13 2214   07/24/13 0845  [MAR Hold]  cefoTEtan (CEFOTAN) 1 g in dextrose 5 % 50 mL IVPB     (On MAR Hold since 07/24/13 0854)   1 g 100 mL/hr over 30 Minutes Intravenous  Once 07/24/13 0842 07/24/13 0953   07/23/13 1000  neomycin (MYCIFRADIN) tablet 1,000 mg     1,000 mg Oral 3 times daily between meals 07/22/13 0903 07/23/13 2039   07/23/13 1000  erythromycin (E-MYCIN) tablet 1,000 mg     1,000 mg Oral 3 times daily between meals 07/22/13 0903 07/23/13 2039   07/18/13 1830  ciprofloxacin (CIPRO) IVPB 400 mg  Status:  Discontinued     400 mg 200 mL/hr over 60 Minutes Intravenous Every 12 hours 07/18/13 1756 07/21/13 1023   07/18/13 1830  metroNIDAZOLE (FLAGYL) IVPB 500 mg  Status:  Discontinued     500 mg 100 mL/hr over 60  Minutes Intravenous Every 8 hours 07/18/13 1756 07/21/13 1023      Assessment Principal Problem:   T3N1 Adenocarcinoma of distal transverse colon, partially obstructing, s/p near subtotal colectomy-altered mental status last night due to too much Morphine; he tolerates small doses okay.     LOS: 10 days   Plan: Heplock IV.  Use oral analgesic.    Shamariah Shewmake,Daniell J 07/28/2013

## 2013-07-28 NOTE — Progress Notes (Signed)
[facility] PROGRESS NOTE  Luke Miller IHK:[id number] DOB: 24-Apr-1966 DOA: 07/18/2013 PCP: [name], MD  Brief narrative: 48 y.o. male with a PMH of testosterone deficiency and anxiety who presented from his PCP office 07/18/13 with a six-week history of worsening abdominal pain, nausea and nonbloody vomiting. He has also lost approximately 10 pounds since his illness began. A CT scan done on admission showed thickening of the splenic flexure. Pt was ultimately found to have invasive colonic adenocarcinoma status post subtotal colectomy on 07/24/2013 with negative resection margins based on pathology report.   Assessment/Plan:   Principal Problem:  Partially obstructing left colon cancer  - s/p near subtotal colectomy 07/24/13  - Had 2 BM in past 24 hours - continue morphine 2-3 mg every 2 hours IV as needed for severe pain. Continue scheduled Toradol 30 mg IV every 6 hours.  - appreciate surgery following; diet as tolerates  - oncology to see the pt prior to discharge to set up outpt follow up; did not ask for official consult; I have shared the pathology results with the family this morning  Active Problems:  Leukocytosis with fever  - possibly post op related  - no further febrile episodes; antibiotics on hold  - blood cultures so far showed no growth to date and CXR did not reveal acute cardiopulmonary findings  - White blood cell count is within normal limits  Acute encephalopathy, delirium - perhaps due to pain medications, pain - mentals status better - will continue to monitor Low TSH  - Free T4 within normal limits.  Testosterone deficiency  - Continue testosterone hormone replacement every 2 weeks.  Gen. anxiety disorder  - Continue Xanax each bedtime.  DVT Prophylaxis  Continue SCDs.   Code Status: Full.  Family Communication: Wife updated at the bedside  Disposition Plan: Home when stable.   IV access:  Peripheral IV Medical Consultants:  Dr. Arta Silence, Gastroenterology  Dr. Ralene Ok and Dr. Jackolyn Confer, Surgery  Oncology - will see the pt prior to discharge to set up outpt follow up; not an official consult  Other Consultants:  None Anti-infectives:  Cipro 07/18/13--->07/24/13  Flagyl 07/18/13--->07/24/13   Leisa Lenz, MD  [facility] Pager [phone]  If 7PM-7AM, please contact night-coverage [email or web address] Password [email or web address] 07/28/2013, 7:17 AM   LOS: 10 days    HPI/Subjective: Yesterday afternoon pt suddenly very confused, hallucinating and slightly agitated after he woke up from 4 hour nap. He then walked to the bathroom and shortly thereafter back to bed and his mental status slowly improved to baseline where he was oriented to time, place and person.   Objective: Filed Vitals:   07/27/13 1120 07/27/13 1157 07/27/13 2352 07/28/13 0548  BP: 171/97 178/101 143/80 124/79  Pulse: 89 74 100 75  Temp: 98.8 F (37.1 C)  98.6 F (37 C) 98 F (36.7 C)  TempSrc: Oral  Oral Oral  Resp: 18  20 20   Height:      Weight:      SpO2: 100%  98% 98%    Intake/Output Summary (Last 24 hours) at 07/28/13 0717 Last data filed at 07/28/13 0419  Gross per 24 hour  Intake    950 ml  Output   1130 ml  Net   -180 ml    Exam:   General:  Pt is sleeping this am, no acute distress  Cardiovascular: Regular rate and rhythm, S1/S2, no murmurs, no rubs, no gallops  Respiratory: Clear to auscultation bilaterally, no wheezing, no  crackles, no rhonchi  Abdomen: Soft, non tender, non distended, bowel sounds present, no guarding; drain in place  Extremities: No edema, pulses DP and PT palpable bilaterally  Neuro: Grossly nonfocal  Data Reviewed: Basic Metabolic Panel:  Recent Labs Lab 07/25/13 0045 07/26/13 0316  NA 135* 136*  K 4.4 4.4  CL 96 100  CO2 26 30  GLUCOSE 172* 113*  BUN 7 6  CREATININE 0.96 1.00  CALCIUM 8.8 8.5   Liver Function Tests: No results found for this basename: AST, ALT, ALKPHOS,  BILITOT, PROT, ALBUMIN,  in the last 168 hours No results found for this basename: LIPASE, AMYLASE,  in the last 168 hours No results found for this basename: AMMONIA,  in the last 168 hours CBC:  Recent Labs Lab 07/25/13 0045 07/26/13 0316 07/27/13 0320  WBC 12.2* 8.7 8.4  HGB 13.1 10.5* 10.5*  HCT 40.0 33.9* 33.8*  MCV 74.3* 76.9* 76.5*  PLT 293 235 280   Cardiac Enzymes: No results found for this basename: CKTOTAL, CKMB, CKMBINDEX, TROPONINI,  in the last 168 hours BNP: No components found with this basename: POCBNP,  CBG:  Recent Labs Lab 07/21/13 0800 07/22/13 0524 07/23/13 0729 07/24/13 0719  GLUCAP 90 85 120* 88    MRSA PCR SCREENING     Status: None   Collection Time    07/20/13  5:00 PM      Result Value Ref Range Status   MRSA by PCR NEGATIVE  NEGATIVE Final  SURGICAL PCR SCREEN     Status: None   Collection Time    07/23/13  8:42 PM      Result Value Ref Range Status   MRSA, PCR NEGATIVE  NEGATIVE Final  CULTURE, BLOOD (ROUTINE X 2)     Status: None   Collection Time    07/25/13 12:45 AM      Result Value Ref Range Status   Specimen Description BLOOD RIGHT ANTECUBITAL   Final   Value:        BLOOD CULTURE RECEIVED NO GROWTH TO DATE CULTURE WILL BE HELD FOR 5 DAYS BEFORE ISSUING A FINAL NEGATIVE REPORT     Performed at [facility]   Report Status PENDING   Incomplete  CULTURE, BLOOD (ROUTINE X 2)     Status: None   Collection Time    07/25/13 12:50 AM      Result Value Ref Range Status   Specimen Description BLOOD RIGHT HAND   Final   Value:        BLOOD CULTURE RECEIVED NO GROWTH TO DATE CULTURE WILL BE HELD FOR 5 DAYS BEFORE ISSUING A FINAL NEGATIVE REPORT     Performed at [facility]   Report Status PENDING   Incomplete     Studies: No results found.  Scheduled Meds: . feeding supplement (RESOURCE BREEZE)  1 Container Oral TID BM  . heparin subcutaneous  5,000 Units Subcutaneous 3 times per day  . ketorolac  30 mg  Intravenous 4 times per day  . pantoprazole  40 mg Oral BID AC   Continuous Infusions: . dextrose 5% lactated ringers with KCl 20 mEq/L 100 mL/hr (07/28/13 0547)

## 2013-07-29 MED ORDER — ONDANSETRON HCL 4 MG PO TABS: 4.0000 mg | ORAL_TABLET | Freq: Four times a day (QID) | ORAL | Status: AC | PRN

## 2013-07-29 MED ORDER — ALPRAZOLAM 1 MG PO TABS: 1.0000 mg | ORAL_TABLET | Freq: Three times a day (TID) | ORAL | Status: DC | PRN

## 2013-07-29 MED ORDER — OXYCODONE HCL 30 MG PO TABS: 30.0000 mg | ORAL_TABLET | ORAL | Status: DC | PRN

## 2013-07-29 NOTE — Discharge Summary (Signed)
Physician Discharge Summary  Luke Miller [id number] DOB: 08-May-1966 DOA: 07/18/2013  PCP: [name], MD  Admit date: 07/18/2013 Discharge date: 07/29/2013  Recommendations for Outpatient Follow-up:  1. Patient instructed to follow up with oncology as discussed with Dr. [name] 2. I spoke with the patient and his wife about the nutrition. 3. Follow up TSH level in 3 months from discharge   Discharge Diagnoses:  Principal Problem:   Adenocarcinoma of colon Active Problems:   Testosterone deficiency   Gen. anxiety disorder   Low TSH level   Large bowel obstruction   Colon cancer    Discharge Condition: medically stable for discharge home today  Diet recommendation: as tolerated   History of present illness:  48 y.o. male with a PMH of testosterone deficiency and anxiety who presented from his PCP office 07/18/13 with a six-week history of worsening abdominal pain, nausea and nonbloody vomiting. He has also lost approximately 10 pounds since his illness began. A CT scan done on admission showed thickening of the splenic flexure. Pt was ultimately found to have invasive colonic adenocarcinoma status post subtotal colectomy on 07/24/2013 with negative resection margins based on pathology report.   Assessment/Plan:   Principal Problem:  Partially obstructing left colon cancer  - s/p near subtotal colectomy 07/24/13  - Had bowel movements - pt to have likely adjuvant chemo in about 6 - 8 weeks post surgery, this will be discussed with the oncology outpatient  Active Problems:  Leukocytosis with fever  - possibly post op related  - no further febrile episodes; antibiotics on hold  - blood cultures so far showed no growth to date and CXR did not reveal acute cardiopulmonary findings  - White blood cell count is within normal limits  Acute encephalopathy, delirium  - perhaps due to pain medications, pain  - mentals status at baseline Low TSH  - Free T4 within normal limits.   Testosterone deficiency  - Continue testosterone hormone replacement every 2 weeks.  Gen. anxiety disorder  - Continue Xanax each bedtime.  DVT Prophylaxis  - Given SCD's while in hospital.  Code Status: Full.  Family Communication: Wife updated at the bedside   IV access:  Peripheral IV Medical Consultants:  Dr. Arta Silence, Gastroenterology  Dr. Ralene Ok and Dr. Jackolyn Confer, Surgery  Oncology - will see the pt prior to discharge to set up outpt follow up; not an official consult  Other Consultants:  None Anti-infectives:  Cipro 07/18/13--->07/24/13  Flagyl 07/18/13--->07/24/13  Signed:  Leisa Lenz, MD  [facility] 07/29/2013, 10:11 AM  Pager #: [phone]   Discharge Exam: Filed Vitals:   07/29/13 0559  BP: 120/76  Pulse: 68  Temp: 97.8 F (36.6 C)  Resp: 15   Filed Vitals:   07/28/13 0548 07/28/13 1430 07/28/13 2120 07/29/13 0559  BP: 124/79 147/85 122/74 120/76  Pulse: 75 86 91 68  Temp: 98 F (36.7 C) 97.5 F (36.4 C) 99.1 F (37.3 C) 97.8 F (36.6 C)  TempSrc: Oral Oral Oral Oral  Resp: 20 20 16 15   Height:      Weight:      SpO2: 98% 100% 100% 99%    General: Pt is alert, follows commands appropriately, not in acute distress Cardiovascular: Regular rate and rhythm, S1/S2 +, no murmurs, no rubs, no gallops Respiratory: Clear to auscultation bilaterally, no wheezing, no crackles, no rhonchi Abdominal: Soft, non tender, non distended, bowel sounds +, no guarding Extremities: no edema, no cyanosis, pulses palpable bilaterally DP and  PT Neuro: Grossly nonfocal  Discharge Instructions  Discharge Orders   Future Appointments Provider Department Dept Phone   08/11/2013 1:30 PM [facility] [phone]   08/11/2013 2:00 PM Ladell Pier, MD [facility] [phone]   Future Orders Complete By Expires   Call MD for:  difficulty breathing,  headache or visual disturbances  As directed    Call MD for:  persistant dizziness or light-headedness  As directed    Call MD for:  persistant nausea and vomiting  As directed    Call MD for:  redness, tenderness, or signs of infection (pain, swelling, redness, odor or green/yellow discharge around incision site)  As directed    Diet - low sodium heart healthy  As directed    Increase activity slowly  As directed        Medication List    STOP taking these medications       promethazine 12.5 MG suppository  Commonly known as:  PHENERGAN      TAKE these medications       ALPRAZolam 1 MG tablet  Commonly known as:  XANAX  Take 1 tablet (1 mg total) by mouth 3 (three) times daily as needed for anxiety.     ondansetron 4 MG tablet  Commonly known as:  ZOFRAN  Take 1 tablet (4 mg total) by mouth every 6 (six) hours as needed for nausea.     oxycodone 30 MG immediate release tablet  Commonly known as:  ROXICODONE  Take 1 tablet (30 mg total) by mouth every 4 (four) hours as needed for pain.     testosterone cypionate 200 MG/ML injection  Commonly known as:  DEPO-TESTOSTERONE  Inject 1 mL (200 mg total) into the muscle every 14 (fourteen) days.           Follow-up Information   Follow up with [name], MD. Schedule an appointment as soon as possible for a visit in 1 month.   Specialty:  Family Medicine   Contact information:   [address] [place] [address] [phone]        The results of significant diagnostics from this hospitalization (including imaging, microbiology, ancillary and laboratory) are listed below for reference.    Significant Diagnostic Studies: Ct Abdomen Pelvis Wo Contrast  07/18/2013   CLINICAL DATA Abdominal pain and cramping, weight loss  EXAM CT ABDOMEN AND PELVIS WITHOUT CONTRAST  TECHNIQUE Multidetector CT imaging of the abdomen and pelvis was performed following the standard protocol without IV contrast.  COMPARISON None.   FINDINGS Lung bases clear. Normal heart size. No pericardial or pleural effusion.  Abdomen: Kidneys demonstrate no acute obstruction, hydronephrosis, or obstructing ureteral calculus on either side.  Liver, gallbladder, biliary system, pancreas, spleen, and adrenal glands are within normal limits for age and noncontrast imaging.  No abdominal free fluid, fluid collection, hemorrhage, or abscess.  In the left upper quadrant, the splenic flexure of the colon demonstrates an approximately 11 cm segment of abnormal bowel wall thickening with surrounding strandy pericolonic edema. Small adjacent mesenteric lymph nodes noted, measuring 5 mm or less in size. Appearance is nonspecific by compatible with a short segment of acute colitis versus a colonic malignancy. Proximal to this, there is a large amount of retained stool within the cecum, right colon, and transverse colon. Transverse colon is also moderately distended with air. This appears to represent some degree of partial colonic obstruction. Small bowel is  not dilated.  Pelvis: No pelvic free fluid, fluid collection, hemorrhage, abscess, adenopathy, inguinal abnormality, or hernia. Urinary bladder collapsed. Minor distal colon diverticulosis. Distal colon is collapsed. Prostate gland is mildly enlarged.  Degenerative changes of the spine. Previous T12 corpectomy with fusion. No definite acute osseous finding.  IMPRESSION Abnormal splenic flexure of the colon in the left upper quadrant with wall thickening, surrounding pericolonic strandy edema and prominent adjacent nonenlarged lymph nodes compatible with either acute colitis versus a colonic malignancy.  Proximal colon is distended with retained formed stool and air, compatible with some degree of partial colonic obstruction.  No free air or abscess  These results were called by telephone at the time of interpretation on 07/18/2013 at 2:34 PM to Dr. [name] , who verbally acknowledged these results.  SIGNATURE   Electronically Signed   By: Daryll Brod M.D.   On: 07/18/2013 14:36   Dg Chest 2 View  07/18/2013   CLINICAL DATA Nausea and weight loss.  EXAM CHEST - 2 VIEW  COMPARISON None  FINDINGS The heart size and mediastinal contours are within normal limits. There is no evidence of pulmonary edema, consolidation, pneumothorax, nodule or pleural fluid. There is evidence of prior spinal fusion and corpectomy in the lower thoracic spine/upper lumbar spine.  IMPRESSION No active disease.  SIGNATURE  Electronically Signed   By: Aletta Edouard M.D.   On: 07/18/2013 13:33   Dg Abd 1 View  07/18/2013   CLINICAL DATA:  Nausea and weight loss  EXAM: ABDOMEN - 1 VIEW  COMPARISON:  None.  FINDINGS: The upper abdomen is not completely included in these images. The bowel gas pattern is nonobstructive. There are postsurgical changes of fusion spanning T12 through L2.  IMPRESSION: Nonobstructive bowel gas pattern. The upper abdomen is not completely included in the images.   Electronically Signed   By: Curlene Dolphin M.D.   On: 07/18/2013 11:58   Ct Virtual Colonoscopy Diagnostic  07/21/2013   CLINICAL DATA:  Incomplete colonoscopy performed yesterday due to obstructing mass of the splenic flexure.  EXAM: CT VIRTUAL COLONOSCOPY DIAGNOSTIC  TECHNIQUE: The patient was not prepped specifically for this procedure. The patient was given oral Gastrografin just prior to the procedure. The quality of the bowel preparation is moderate. Automated CO2 insufflation of the colon was performed prior to image acquisition and colonic distention is moderate. Image post processing was used to generate a 3D endoluminal fly-through projection of the colon and to electronically subtract stool/fluid as appropriate.  COMPARISON:  CT 07/18/2013.  Colonoscopy report from 07/20/2013.  FINDINGS: There is a large apple core type lesion at the splenic flexure corresponding to the mass seen on prior CT and colonoscopy. Markedly irregular wall thickening in  this area. The more proximal colon was insufflated. There is layering Gastrografin contrast within the right colon and transverse colon. No persistent Large masses or annular constricting lesions noted within the right colon or transverse colon.  There was an area of bulbous haustral thickening in the sigmoid colon approximately 22-25 cm from the anal verge. This would have been visible by optical colonoscopy. Recommend clinical correlation.  IMPRESSION: Large annular constricting apple-core lesion at the splenic flexure as seen on prior CT and colonoscopy. Findings most compatible with colon cancer. No additional Large or annular constricting lesion within the proximal colon.  Bulbous thickening of a haustra within the sigmoid colon. Recommend correlation with recent colonoscopy. This could represent adherent stool.  Virtual colonoscopy is not designed to detect diminutive  polyps (i.e., less than or equal to 5 mm), the presence or absence of which may not affect clinical management.  CT ABDOMEN AND PELVIS WITHOUT CONTRAST  FINDINGS: Liver, spleen, pancreas, adrenals and kidneys have an unremarkable unenhanced appearance. Small bowel is decompressed. No free fluid, free air or adenopathy. Urinary bladder is unremarkable. Aorta is normal caliber.  Lung bases are clear.  IMPRESSION: No additional abnormality seen in the abdomen or pelvis.  These results were called by telephone at the time of interpretation on 07/21/2013 at 1:57 PM to Dr. Ralene Ok , who verbally acknowledged these results.   Electronically Signed   By: Rolm Baptise M.D.   On: 07/21/2013 13:58   Dg Chest Port 1 View  07/25/2013   CLINICAL DATA:  Fever.  EXAM: PORTABLE CHEST - 1 VIEW  COMPARISON:  Chest x-ray 01/04/2012.  FINDINGS: Lung volumes are low. No consolidative airspace disease. No pleural effusions. No evidence of pulmonary edema. Heart size is normal. The patient is rotated to the left on today's exam, resulting in distortion of the  mediastinal contours and reduced diagnostic sensitivity and specificity for mediastinal pathology. Orthopedic fixation hardware in the upper lumbar spine.  IMPRESSION: Low lung volumes without radiographic evidence of acute cardiopulmonary disease.   Electronically Signed   By: Vinnie Langton M.D.   On: 07/25/2013 08:06    Microbiology: Recent Results (from the past 240 hour(s))  MRSA PCR SCREENING     Status: None   Collection Time    07/20/13  5:00 PM      Result Value Ref Range Status   MRSA by PCR NEGATIVE  NEGATIVE Final   Comment:            The GeneXpert MRSA Assay (FDA     approved for NASAL specimens     only), is one component of a     comprehensive MRSA colonization     surveillance program. It is not     intended to diagnose MRSA     infection nor to guide or     monitor treatment for     MRSA infections.  SURGICAL PCR SCREEN     Status: None   Collection Time    07/23/13  8:42 PM      Result Value Ref Range Status   MRSA, PCR NEGATIVE  NEGATIVE Final   Staphylococcus aureus NEGATIVE  NEGATIVE Final   Comment:            The Xpert SA Assay (FDA     approved for NASAL specimens     in patients over 1 years of age),     is one component of     a comprehensive surveillance     program.  Test performance has     been validated by [employer] for patients greater     than or equal to 29 year old.     It is not intended     to diagnose infection nor to     guide or monitor treatment.  CULTURE, BLOOD (ROUTINE X 2)     Status: None   Collection Time    07/25/13 12:45 AM      Result Value Ref Range Status   Specimen Description BLOOD RIGHT ANTECUBITAL   Final   Special Requests BOTTLES DRAWN AEROBIC AND ANAEROBIC 5CC   Final   Culture  Setup Time     Final   Value: 07/25/2013 04:35  Performed at [facility]     Final   Value:        BLOOD CULTURE RECEIVED NO GROWTH TO DATE CULTURE WILL BE HELD FOR 5 DAYS BEFORE ISSUING A FINAL NEGATIVE  REPORT     Performed at [facility]   Report Status PENDING   Incomplete  CULTURE, BLOOD (ROUTINE X 2)     Status: None   Collection Time    07/25/13 12:50 AM      Result Value Ref Range Status   Specimen Description BLOOD RIGHT HAND   Final   Special Requests BOTTLES DRAWN AEROBIC ONLY 2CC   Final   Culture  Setup Time     Final   Value: 07/25/2013 04:36     Performed at [facility]   Culture     Final   Value:        BLOOD CULTURE RECEIVED NO GROWTH TO DATE CULTURE WILL BE HELD FOR 5 DAYS BEFORE ISSUING A FINAL NEGATIVE REPORT     Performed at [facility]   Report Status PENDING   Incomplete     Labs: Basic Metabolic Panel:  Recent Labs Lab 07/25/13 0045 07/26/13 0316  NA 135* 136*  K 4.4 4.4  CL 96 100  CO2 26 30  GLUCOSE 172* 113*  BUN 7 6  CREATININE 0.96 1.00  CALCIUM 8.8 8.5   Liver Function Tests: No results found for this basename: AST, ALT, ALKPHOS, BILITOT, PROT, ALBUMIN,  in the last 168 hours No results found for this basename: LIPASE, AMYLASE,  in the last 168 hours No results found for this basename: AMMONIA,  in the last 168 hours CBC:  Recent Labs Lab 07/25/13 0045 07/26/13 0316 07/27/13 0320  WBC 12.2* 8.7 8.4  HGB 13.1 10.5* 10.5*  HCT 40.0 33.9* 33.8*  MCV 74.3* 76.9* 76.5*  PLT 293 235 280   Cardiac Enzymes: No results found for this basename: CKTOTAL, CKMB, CKMBINDEX, TROPONINI,  in the last 168 hours BNP: BNP (last 3 results) No results found for this basename: PROBNP,  in the last 8760 hours CBG:  Recent Labs Lab 07/23/13 0729 07/24/13 0719  GLUCAP 120* 88    Time coordinating discharge: Over 30 minutes

## 2013-07-29 NOTE — Progress Notes (Signed)
Pt. Left via wheelchair with spouse. No respiratory distress noted.

## 2013-07-29 NOTE — Progress Notes (Signed)
General Surgery Note  LOS: 11 days  POD -  5 Days Post-Op  Assessment/Plan: 1.  LAPAROSCOPIC ASSISTED NEAR SUBTOTAL COLECTOMY WITH INCIDENTAL APPENDECTOMY - 07/24/2013 - T. Rosenbower  For obstructing splenic flexure cancer (T3, N1c) - 0/23 nodes.  N1c for discontinuous nodules of tumor.  Seen by Dr. Judyann Munson for oncology - he plans follow up in [place].  Drain out.  Looks good from our standpoint.  Okay to go home.  He is to call our office and get the remainder of his staples out later this week. He should see Dr. Zella Richer in our office in about 2 weeks.  2.  DVT prophylaxis - SQ Heparin 3.  History of anxiety.   Principal Problem:   Adenocarcinoma of colon Active Problems:   Testosterone deficiency   Gen. anxiety disorder   Low TSH level   Large bowel obstruction   Colon cancer   Subjective:  Eating reg food.  No pain.  Had BM.  Looks good.  [birth year] birthday! Objective:   Filed Vitals:   07/29/13 0559  BP: 120/76  Pulse: 68  Temp: 97.8 F (36.6 C)  Resp: 15     Intake/Output from previous day:  03/20 0701 - 03/21 0700 In: 1040 [P.O.:240] Out: 180 [Drains:180]  Intake/Output this shift:      Physical Exam:   General: WN WM who is alert and oriented.    HEENT: Normal. Pupils equal. .   Lungs: Clear   Abdomen: Soft.  BS present.   Wound: Clean.  Drain LUQ with serous fluid.  I removed the drain.  I removed the single staples.   Lab Results:    Recent Labs  07/27/13 0320  WBC 8.4  HGB 10.5*  HCT 33.8*  PLT 280    BMET  No results found for this basename: NA, K, CL, CO2, GLUCOSE, BUN, CREATININE, CALCIUM,  in the last 72 hours  PT/INR  No results found for this basename: LABPROT, INR,  in the last 72 hours  ABG  No results found for this basename: PHART, PCO2, PO2, HCO3,  in the last 72 hours   Studies/Results:  No results found.   Anti-infectives:   Anti-infectives   Start     Dose/Rate Route Frequency Ordered Stop   07/24/13 2200   cefoTEtan (CEFOTAN) 2 g in dextrose 5 % 50 mL IVPB     2 g 100 mL/hr over 30 Minutes Intravenous Every 12 hours 07/24/13 1605 07/24/13 2214   07/24/13 0845  [MAR Hold]  cefoTEtan (CEFOTAN) 1 g in dextrose 5 % 50 mL IVPB     (On MAR Hold since 07/24/13 0854)   1 g 100 mL/hr over 30 Minutes Intravenous  Once 07/24/13 0842 07/24/13 0953   07/23/13 1000  neomycin (MYCIFRADIN) tablet 1,000 mg     1,000 mg Oral 3 times daily between meals 07/22/13 0903 07/23/13 2039   07/23/13 1000  erythromycin (E-MYCIN) tablet 1,000 mg     1,000 mg Oral 3 times daily between meals 07/22/13 0903 07/23/13 2039   07/18/13 1830  ciprofloxacin (CIPRO) IVPB 400 mg  Status:  Discontinued     400 mg 200 mL/hr over 60 Minutes Intravenous Every 12 hours 07/18/13 1756 07/21/13 1023   07/18/13 1830  metroNIDAZOLE (FLAGYL) IVPB 500 mg  Status:  Discontinued     500 mg 100 mL/hr over 60 Minutes Intravenous Every 8 hours 07/18/13 1756 07/21/13 1023      Alphonsa Overall, MD, FACS Pager:  [facility] Office: [phone] 07/29/2013

## 2013-07-29 NOTE — Progress Notes (Signed)
Discharge teaching completed with pt. and spouse with teach back. Prescriptions given for Oxycodone, Xanax, Zofran. Zofran is only new medication for pt. Zofran handout given. Discharge instruction handout given and reviewed. Instructed to call for any sign and symptoms of infection at incision site. Pt. To follow up with [facility] 08/11/13.

## 2013-07-31 ENCOUNTER — Telehealth: Payer: Self-pay

## 2013-07-31 LAB — CULTURE, BLOOD (ROUTINE X 2)
Culture: NO GROWTH
Culture: NO GROWTH

## 2013-07-31 NOTE — Telephone Encounter (Signed)
This should really be done by his surgeon. The surgeon needs to know that he is in this much pain.Luke Miller Please call and discuss with the surgeon first.

## 2013-07-31 NOTE — Telephone Encounter (Signed)
Hospitalist has up pain meds  Has enough till Saturday.  Needs new RX for Oxycodone 30 mg 1 tablet q 4 hrs.

## 2013-08-01 ENCOUNTER — Telehealth (INDEPENDENT_AMBULATORY_CARE_PROVIDER_SITE_OTHER): Payer: Self-pay

## 2013-08-01 ENCOUNTER — Encounter (HOSPITAL_COMMUNITY): Payer: Self-pay

## 2013-08-01 NOTE — Telephone Encounter (Signed)
As long his PCP can put on 1/2 inch steri strips close together that is fine.

## 2013-08-01 NOTE — Telephone Encounter (Signed)
Pt requesting his PCP remove staples from surgery.  He lives in [place] and it would be convenient for him.  He does have a post op appt with Dr. Zella Richer on 08/16/13.  Will let him know once I hear from TR.

## 2013-08-01 NOTE — Telephone Encounter (Signed)
Pt wife made aware of below msg.

## 2013-08-01 NOTE — Telephone Encounter (Signed)
PCP can remove the staples as long as he/she applies steri strips to the incision.

## 2013-08-02 ENCOUNTER — Encounter: Payer: Self-pay | Admitting: Family Medicine

## 2013-08-02 ENCOUNTER — Ambulatory Visit (INDEPENDENT_AMBULATORY_CARE_PROVIDER_SITE_OTHER): Payer: BC Managed Care – PPO | Admitting: Family Medicine

## 2013-08-02 ENCOUNTER — Other Ambulatory Visit: Payer: Self-pay

## 2013-08-02 VITALS — BP 136/88 | HR 88 | Temp 99.0°F | Wt 209.0 lb

## 2013-08-02 DIAGNOSIS — C189 Malignant neoplasm of colon, unspecified: Secondary | ICD-10-CM

## 2013-08-02 DIAGNOSIS — Z789 Other specified health status: Secondary | ICD-10-CM

## 2013-08-02 DIAGNOSIS — Z4802 Encounter for removal of sutures: Secondary | ICD-10-CM

## 2013-08-02 MED ORDER — OXYCODONE HCL 5 MG PO TABS: 5.0000 mg | ORAL_TABLET | ORAL | Status: DC | PRN

## 2013-08-02 NOTE — Progress Notes (Signed)
   Subjective:    Patient ID: Luke Miller, male    DOB: 1966/04/11, 48 y.o.   MRN: [id number]  HPI Patient here today for hospital follow up and staple removal.           Patient Active Problem List   Diagnosis Date Noted  . Colon cancer 07/24/2013  . Adenocarcinoma of colon 07/21/2013  . Large bowel obstruction 07/21/2013  . Low TSH level 07/19/2013  . Testosterone deficiency 03/16/2013  . Gen. anxiety disorder 03/16/2013  . Family history of colon cancer 03/16/2013   Outpatient Encounter Prescriptions as of 08/02/2013  Medication Sig  . ALPRAZolam (XANAX) 1 MG tablet Take 1 tablet (1 mg total) by mouth 3 (three) times daily as needed for anxiety.  . ondansetron (ZOFRAN) 4 MG tablet Take 1 tablet (4 mg total) by mouth every 6 (six) hours as needed for nausea.  Marland Kitchen oxyCODONE (ROXICODONE) 30 MG immediate release tablet Take 1 tablet (30 mg total) by mouth every 4 (four) hours as needed for pain.  Marland Kitchen oxyCODONE-acetaminophen (PERCOCET) 10-325 MG per tablet   . testosterone cypionate (DEPO-TESTOSTERONE) 200 MG/ML injection Inject 1 mL (200 mg total) into the muscle every 14 (fourteen) days.    Review of Systems  Constitutional: Positive for activity change, appetite change, fatigue and unexpected weight change.  HENT: Negative.   Eyes: Negative.   Respiratory: Negative.   Cardiovascular: Negative.   Gastrointestinal: Positive for abdominal pain. Negative for nausea, diarrhea, constipation and blood in stool.  Endocrine: Negative.   Genitourinary: Negative.   Musculoskeletal: Positive for back pain.  Skin: Positive for wound (abd wound).  Allergic/Immunologic: Negative.   Neurological: Positive for weakness. Negative for dizziness.  Hematological: Negative.   Psychiatric/Behavioral: Negative.        Objective:   Physical Exam BP 136/88  Pulse 88  Temp(Src) 99 F (37.2 C) (Oral)  Wt 209 lb (94.802 kg)  Patient had 17 staples that were removed in office today. We  used Benzoin on the sides of the incision and placed 1/2 inch steri-strips between each staple until all were removed and all of wound was covered. Patient tolerated well. Area was clean with no drainage and wound was closed with no signs of opening.        Assessment & Plan:  1. Presence of surgical incision  2. Encounter for staple removal    Patient is to follow up with surgeon as planned. He is to call us or surgeon if any problems arise.  Patient requested a refill for pain medication. He will be given a prescription for oxycodone and all are 5 mg #40 one every 4 hours as needed for pain and no refill  Arrie Senate MD

## 2013-08-07 ENCOUNTER — Telehealth: Payer: Self-pay | Admitting: *Deleted

## 2013-08-07 NOTE — Telephone Encounter (Signed)
Spoke with patient's wife by phone.  She stated that her husband had an appointment with Dr. Jacquiline Doe on 08/09/13.  She stated they preferred to go to the local cancer center since it was much closer to home.  She appreciated the phone call.  Will cancel appointment with Dr. Benay Spice as requested per patient's wife.

## 2013-08-09 ENCOUNTER — Telehealth (INDEPENDENT_AMBULATORY_CARE_PROVIDER_SITE_OTHER): Payer: Self-pay

## 2013-08-09 NOTE — Telephone Encounter (Signed)
Patient is needing port placement Per Bonnie @ [facility]  # [phone]

## 2013-08-11 ENCOUNTER — Ambulatory Visit: Payer: BC Managed Care – PPO | Admitting: Oncology

## 2013-08-11 ENCOUNTER — Ambulatory Visit: Payer: BC Managed Care – PPO

## 2013-08-14 ENCOUNTER — Other Ambulatory Visit (INDEPENDENT_AMBULATORY_CARE_PROVIDER_SITE_OTHER): Payer: Self-pay | Admitting: General Surgery

## 2013-08-16 ENCOUNTER — Encounter (HOSPITAL_COMMUNITY): Payer: Self-pay | Admitting: Pharmacy Technician

## 2013-08-16 ENCOUNTER — Encounter (INDEPENDENT_AMBULATORY_CARE_PROVIDER_SITE_OTHER): Payer: BC Managed Care – PPO | Admitting: General Surgery

## 2013-08-17 ENCOUNTER — Encounter (HOSPITAL_COMMUNITY): Payer: Self-pay | Admitting: *Deleted

## 2013-08-17 MED ORDER — CEFAZOLIN SODIUM-DEXTROSE 2-3 GM-% IV SOLR
2.0000 g | INTRAVENOUS | Status: AC
  Administered 2013-08-18: 2 g via INTRAVENOUS
  Filled 2013-08-17: qty 50

## 2013-08-17 NOTE — Progress Notes (Signed)
Pt made aware to stop taking Aspirin, vitamins and herbal medications. Do not take any NSAIDs ie: Ibuprofen, Advil, Naproxen or any medication containing Aspirin.

## 2013-08-18 ENCOUNTER — Ambulatory Visit (HOSPITAL_COMMUNITY): Payer: BC Managed Care – PPO | Admitting: Anesthesiology

## 2013-08-18 ENCOUNTER — Encounter (HOSPITAL_COMMUNITY): Payer: BC Managed Care – PPO | Admitting: Anesthesiology

## 2013-08-18 ENCOUNTER — Ambulatory Visit (HOSPITAL_COMMUNITY): Payer: BC Managed Care – PPO

## 2013-08-18 ENCOUNTER — Ambulatory Visit (HOSPITAL_COMMUNITY)
Admission: RE | Admit: 2013-08-18 | Discharge: 2013-08-18 | Disposition: A | Discharge status: Home / Self Care | Payer: BC Managed Care – PPO | Source: Ambulatory Visit | Attending: General Surgery | Admitting: General Surgery

## 2013-08-18 ENCOUNTER — Encounter (HOSPITAL_COMMUNITY)
Admission: RE | Disposition: A | Discharge status: Home / Self Care | Payer: Self-pay | Source: Ambulatory Visit | Attending: General Surgery

## 2013-08-18 ENCOUNTER — Encounter (HOSPITAL_COMMUNITY): Payer: Self-pay | Admitting: *Deleted

## 2013-08-18 DIAGNOSIS — D649 Anemia, unspecified: Secondary | ICD-10-CM | POA: Insufficient documentation

## 2013-08-18 DIAGNOSIS — Z87311 Personal history of (healed) other pathological fracture: Secondary | ICD-10-CM | POA: Insufficient documentation

## 2013-08-18 DIAGNOSIS — C189 Malignant neoplasm of colon, unspecified: Secondary | ICD-10-CM | POA: Insufficient documentation

## 2013-08-18 DIAGNOSIS — Z9049 Acquired absence of other specified parts of digestive tract: Secondary | ICD-10-CM | POA: Insufficient documentation

## 2013-08-18 DIAGNOSIS — F411 Generalized anxiety disorder: Secondary | ICD-10-CM | POA: Insufficient documentation

## 2013-08-18 HISTORY — DX: Other complications of anesthesia, initial encounter: T88.59XA

## 2013-08-18 HISTORY — DX: Anemia, unspecified: D64.9

## 2013-08-18 HISTORY — PX: PORTACATH PLACEMENT: SHX2246

## 2013-08-18 HISTORY — DX: Adverse effect of unspecified anesthetic, initial encounter: T41.45XA

## 2013-08-18 LAB — CBC
HEMATOCRIT: 35.1 % — AB (ref 39.0–52.0)
Hemoglobin: 11.1 g/dL — ABNORMAL LOW (ref 13.0–17.0)
MCH: 24 pg — ABNORMAL LOW (ref 26.0–34.0)
MCHC: 31.6 g/dL (ref 30.0–36.0)
MCV: 76 fL — ABNORMAL LOW (ref 78.0–100.0)
PLATELETS: 216 10*3/uL (ref 150–400)
RBC: 4.62 MIL/uL (ref 4.22–5.81)
RDW: 14.7 % (ref 11.5–15.5)
WBC: 3.9 10*3/uL — ABNORMAL LOW (ref 4.0–10.5)

## 2013-08-18 LAB — BASIC METABOLIC PANEL WITH GFR
BUN: 8 mg/dL (ref 6–23)
CO2: 28 meq/L (ref 19–32)
Calcium: 9 mg/dL (ref 8.4–10.5)
Chloride: 105 meq/L (ref 96–112)
Creatinine, Ser: 1.03 mg/dL (ref 0.50–1.35)
GFR calc Af Amer: >90 mL/min
GFR calc non Af Amer: 84 mL/min — ABNORMAL LOW
Glucose, Bld: 88 mg/dL (ref 70–99)
Potassium: 4.6 meq/L (ref 3.7–5.3)
Sodium: 143 meq/L (ref 137–147)

## 2013-08-18 SURGERY — INSERTION, TUNNELED CENTRAL VENOUS DEVICE, WITH PORT
Anesthesia: Monitor Anesthesia Care | Site: Chest | Laterality: Right

## 2013-08-18 MED ORDER — HEPARIN SODIUM (PORCINE) 5000 UNIT/ML IJ SOLN
INTRAMUSCULAR | Status: DC | PRN
  Administered 2013-08-18: 13:00:00

## 2013-08-18 MED ORDER — MIDAZOLAM HCL 2 MG/2ML IJ SOLN
INTRAMUSCULAR | Status: AC
  Filled 2013-08-18: qty 2

## 2013-08-18 MED ORDER — ONDANSETRON HCL 4 MG/2ML IJ SOLN
INTRAMUSCULAR | Status: AC
  Filled 2013-08-18: qty 2

## 2013-08-18 MED ORDER — PROMETHAZINE HCL 25 MG/ML IJ SOLN: 6.2500 mg | INTRAMUSCULAR | Status: DC | PRN

## 2013-08-18 MED ORDER — ACETAMINOPHEN 650 MG RE SUPP
650.0000 mg | RECTAL | Status: DC | PRN
  Filled 2013-08-18: qty 1

## 2013-08-18 MED ORDER — LIDOCAINE HCL (CARDIAC) 20 MG/ML IV SOLN
INTRAVENOUS | Status: AC
  Filled 2013-08-18: qty 5

## 2013-08-18 MED ORDER — ACETAMINOPHEN 325 MG PO TABS
650.0000 mg | ORAL_TABLET | ORAL | Status: DC | PRN
  Filled 2013-08-18: qty 2

## 2013-08-18 MED ORDER — HYDROMORPHONE HCL PF 1 MG/ML IJ SOLN: 0.2500 mg | INTRAMUSCULAR | Status: DC | PRN

## 2013-08-18 MED ORDER — OXYCODONE HCL 5 MG PO TABS: 5.0000 mg | ORAL_TABLET | ORAL | Status: DC | PRN

## 2013-08-18 MED ORDER — LACTATED RINGERS IV SOLN
INTRAVENOUS | Status: DC
Start: 2013-08-18 — End: 2013-08-18
  Administered 2013-08-18: 50 mL/h via INTRAVENOUS

## 2013-08-18 MED ORDER — HEPARIN SOD (PORK) LOCK FLUSH 100 UNIT/ML IV SOLN
INTRAVENOUS | Status: DC | PRN
  Administered 2013-08-18: 500 [IU] via INTRAVENOUS

## 2013-08-18 MED ORDER — MIDAZOLAM HCL 5 MG/5ML IJ SOLN
INTRAMUSCULAR | Status: DC | PRN
  Administered 2013-08-18: 2 mg via INTRAVENOUS

## 2013-08-18 MED ORDER — ARTIFICIAL TEARS OP OINT
TOPICAL_OINTMENT | OPHTHALMIC | Status: AC
  Filled 2013-08-18: qty 3.5

## 2013-08-18 MED ORDER — PROPOFOL INFUSION 10 MG/ML OPTIME
INTRAVENOUS | Status: DC | PRN
  Administered 2013-08-18: 50 ug/kg/min via INTRAVENOUS

## 2013-08-18 MED ORDER — HEPARIN SOD (PORK) LOCK FLUSH 100 UNIT/ML IV SOLN
INTRAVENOUS | Status: AC
  Filled 2013-08-18: qty 5

## 2013-08-18 MED ORDER — FENTANYL CITRATE 0.05 MG/ML IJ SOLN
INTRAMUSCULAR | Status: DC | PRN
  Administered 2013-08-18: 50 ug via INTRAVENOUS
  Administered 2013-08-18: 100 ug via INTRAVENOUS

## 2013-08-18 MED ORDER — LIDOCAINE HCL (PF) 1 % IJ SOLN
INTRAMUSCULAR | Status: DC | PRN
  Administered 2013-08-18: 30 mL

## 2013-08-18 MED ORDER — FENTANYL CITRATE 0.05 MG/ML IJ SOLN
INTRAMUSCULAR | Status: AC
  Filled 2013-08-18: qty 5

## 2013-08-18 MED ORDER — PROPOFOL 10 MG/ML IV BOLUS
INTRAVENOUS | Status: DC | PRN
  Administered 2013-08-18: 20 mg via INTRAVENOUS
  Administered 2013-08-18: 30 mg via INTRAVENOUS

## 2013-08-18 MED ORDER — PROPOFOL 10 MG/ML IV BOLUS
INTRAVENOUS | Status: AC
  Filled 2013-08-18: qty 20

## 2013-08-18 MED ORDER — SODIUM CHLORIDE 0.9 % IJ SOLN: 3.0000 mL | INTRAMUSCULAR | Status: DC | PRN

## 2013-08-18 SURGICAL SUPPLY — 63 items
BAG DECANTER FOR FLEXI CONT (MISCELLANEOUS) ×3 IMPLANT
BENZOIN TINCTURE PRP APPL 2/3 (GAUZE/BANDAGES/DRESSINGS) ×3 IMPLANT
BLADE SURG 11 STRL SS (BLADE) ×3 IMPLANT
BLADE SURG 15 STRL LF DISP TIS (BLADE) ×1 IMPLANT
BLADE SURG 15 STRL SS (BLADE) ×2
CHLORAPREP W/TINT 26ML (MISCELLANEOUS) ×3 IMPLANT
CLOSURE WOUND 1/4 X3 (GAUZE/BANDAGES/DRESSINGS) ×1
CLOSURE WOUND 1/4X4 (GAUZE/BANDAGES/DRESSINGS) ×1
COVER SURGICAL LIGHT HANDLE (MISCELLANEOUS) ×3 IMPLANT
COVER TRANSDUCER ULTRASND GEL (DRAPE) ×3 IMPLANT
CRADLE DONUT ADULT HEAD (MISCELLANEOUS) ×3 IMPLANT
DECANTER SPIKE VIAL GLASS SM (MISCELLANEOUS) ×3 IMPLANT
DRAPE C-ARM 42X72 X-RAY (DRAPES) ×3 IMPLANT
DRAPE LAPAROSCOPIC ABDOMINAL (DRAPES) ×3 IMPLANT
DRAPE UTILITY 15X26 W/TAPE STR (DRAPE) ×6 IMPLANT
DRSG TEGADERM 2-3/8X2-3/4 SM (GAUZE/BANDAGES/DRESSINGS) ×3 IMPLANT
ELECT CAUTERY BLADE 6.4 (BLADE) ×3 IMPLANT
ELECT REM PT RETURN 9FT ADLT (ELECTROSURGICAL) ×3
ELECTRODE REM PT RTRN 9FT ADLT (ELECTROSURGICAL) ×1 IMPLANT
GAUZE SPONGE 2X2 8PLY STRL LF (GAUZE/BANDAGES/DRESSINGS) ×1 IMPLANT
GAUZE SPONGE 4X4 16PLY XRAY LF (GAUZE/BANDAGES/DRESSINGS) ×3 IMPLANT
GLOVE BIO SURGEON STRL SZ8 (GLOVE) ×3 IMPLANT
GLOVE BIOGEL PI IND STRL 7.0 (GLOVE) ×1 IMPLANT
GLOVE BIOGEL PI IND STRL 7.5 (GLOVE) ×1 IMPLANT
GLOVE BIOGEL PI IND STRL 8 (GLOVE) ×2 IMPLANT
GLOVE BIOGEL PI INDICATOR 7.0 (GLOVE) ×2
GLOVE BIOGEL PI INDICATOR 7.5 (GLOVE) ×2
GLOVE BIOGEL PI INDICATOR 8 (GLOVE) ×4
GLOVE ECLIPSE 6.5 STRL STRAW (GLOVE) ×3 IMPLANT
GLOVE ECLIPSE 7.5 STRL STRAW (GLOVE) ×3 IMPLANT
GLOVE ECLIPSE 8.0 STRL XLNG CF (GLOVE) ×3 IMPLANT
GOWN STRL REUS W/ TWL LRG LVL3 (GOWN DISPOSABLE) ×2 IMPLANT
GOWN STRL REUS W/TWL LRG LVL3 (GOWN DISPOSABLE) ×4
INTRODUCER 13FR (MISCELLANEOUS) IMPLANT
INTRODUCER COOK 11FR (CATHETERS) IMPLANT
KIT BASIN OR (CUSTOM PROCEDURE TRAY) ×3 IMPLANT
KIT PORT POWER 8FR ISP CVUE (Catheter) ×3 IMPLANT
KIT PORT POWER 9.6FR MRI PREA (Catheter) IMPLANT
KIT PORT POWER ISP 8FR (Catheter) IMPLANT
KIT POWER CATH 8FR (Catheter) IMPLANT
KIT ROOM TURNOVER OR (KITS) ×3 IMPLANT
NEEDLE 22X1 1/2 (OR ONLY) (NEEDLE) ×3 IMPLANT
NEEDLE HYPO 25GX1X1/2 BEV (NEEDLE) ×3 IMPLANT
NS IRRIG 1000ML POUR BTL (IV SOLUTION) ×3 IMPLANT
PACK SURGICAL SETUP 50X90 (CUSTOM PROCEDURE TRAY) ×3 IMPLANT
PAD ARMBOARD 7.5X6 YLW CONV (MISCELLANEOUS) ×6 IMPLANT
PENCIL BUTTON HOLSTER BLD 10FT (ELECTRODE) ×3 IMPLANT
SET INTRODUCER 12FR PACEMAKER (SHEATH) IMPLANT
SET SHEATH INTRODUCER 10FR (MISCELLANEOUS) IMPLANT
SHEATH COOK PEEL AWAY SET 9F (SHEATH) IMPLANT
SPONGE GAUZE 2X2 STER 10/PKG (GAUZE/BANDAGES/DRESSINGS) ×2
STRIP CLOSURE SKIN 1/4X3 (GAUZE/BANDAGES/DRESSINGS) ×2 IMPLANT
STRIP CLOSURE SKIN 1/4X4 (GAUZE/BANDAGES/DRESSINGS) ×2 IMPLANT
SUT MON AB 4-0 PC3 18 (SUTURE) ×3 IMPLANT
SUT VIC AB 2-0 SH 18 (SUTURE) ×3 IMPLANT
SUT VIC AB 3-0 SH 27 (SUTURE)
SUT VIC AB 3-0 SH 27X BRD (SUTURE) IMPLANT
SYR 20ML ECCENTRIC (SYRINGE) ×6 IMPLANT
SYR 5ML LUER SLIP (SYRINGE) ×3 IMPLANT
SYR CONTROL 10ML LL (SYRINGE) ×3 IMPLANT
TOWEL OR 17X24 6PK STRL BLUE (TOWEL DISPOSABLE) ×3 IMPLANT
TOWEL OR 17X26 10 PK STRL BLUE (TOWEL DISPOSABLE) ×3 IMPLANT
WATER STERILE IRR 1000ML POUR (IV SOLUTION) IMPLANT

## 2013-08-18 NOTE — Interval H&P Note (Signed)
History and Physical Interval Note:  08/18/2013 12:12 PM  Luke Miller  has presented today for surgery, with the diagnosis of colon cancer  The various methods of treatment have been discussed with the patient and family. After consideration of risks, benefits and other options for treatment, the patient has consented to  Procedure(s): INSERTION PORT-A-CATH ULTRA SOUND GUIDED (N/A) as a surgical intervention .  The patient's history has been reviewed, patient examined, no change in status, stable for surgery.  I have reviewed the patient's chart and labs.  Questions were answered to the patient's satisfaction.     Rhunette Croft Janika Jedlicka

## 2013-08-18 NOTE — Op Note (Signed)
Preoperative diagnosis:  Colon cancer  Postoperative diagnosis:  Same    Procedure: Ultrasound-guided Port-A-Cath insertion into right/left internal jugular vein under fluoroscopy.  Surgeon: Jackolyn Confer M.D.  Anesthesia: Local (Xylocaine) with MAC.  Indication:  This is a 48 year old male with colon cancer s/p partial colectomy. He need long-term venous access for chemotherapy.  Technique: He was brought to the operating room, placed supine on the operating table, and intravenous sedation was given. A roll was placed under the back and the arms were tucked. Hair on the chest wall was clipped as was necessary. The upper chest wall and neck were sterilely prepped and draped.  His head was rotated to the left. Using the ultrasound, the right internal jugular vein was identified. Local anesthetic was infiltrated in the skin and subcutaneous tissue just anterior to it. A 16-gauge needle was used to cannulate the right internal jugular vein under ultrasound guidance. A wire was then threaded through the needle into the internal jugular vein and down into the right heart under ultrasound and fluoroscopic guidance.  Local anesthetic was infiltrated into the right upper chest wall. A right upper chest wall incision was made and a pocket was created for the Portacath.  An incision was made around the wire in the neck. The catheter was then tunneled from the chest wall incision up through the neck incision.  A dilator- introducer complex was placed over the wire into the superior vena cava. The dilator and wire were then removed and the catheter was threaded through the peel-away sheath introducer into the right heart. The introducer was then peeled away and removed. Under fluoroscopic guidance, the tip of the catheter was then pulled back until it was at the junction of the superior vena cava and right atrium. The catheter was then connected to the port.  The port aspirated blood and flushed easily.  The  port was then anchored to the chest wall with 2-0 Vicryl suture. Concentrated heparin solution was then placed into the port. The port and catheter position were then verified using fluoroscopy. The subcutaneous tissue was then closed over the port with running 3-0 Vicryl suture. The skin incisions were then closed with 4-0 Monocryl subcuticular stitches. Steri-Strips and sterile dressings were applied.  The procedure was well-tolerated without any apparent complications. He was taken to the recovery room in satisfactory condition where a portable chest x-ray is pending.

## 2013-08-18 NOTE — Discharge Instructions (Signed)
° ° °  PORT-A-CATH: POST OP INSTRUCTIONS  Always review your discharge instruction sheet given to you by the facility where your surgery was performed.   1. A prescription for pain medication may be given to you upon discharge. Take your pain medication as prescribed, if needed. If narcotic pain medicine is not needed, then you make take acetaminophen (Tylenol) or ibuprofen (Advil) as needed.  2. Take your usually prescribed medications unless otherwise directed. 3. If you need a refill on your pain medication, please contact our office. All narcotic pain medicine now requires a paper prescription.  Phoned in and fax refills are no longer allowed by law.  Prescriptions will not be filled after 5 pm or on weekends.  4. You should follow a light diet for the remainder of the day after your procedure. 5. Most patients will experience some mild swelling and/or bruising in the area of the incision. It may take several days to resolve. Apply ice to the area for 24 hours. 6. It is common to experience some constipation if taking pain medication after surgery. Increasing fluid intake and taking a stool softener (such as Colace) will usually help or prevent this problem from occurring. A mild laxative (Milk of Magnesia or Miralax) should be taken according to package directions if there are no bowel movements after 48 hours.  7. Unless discharge instructions indicate otherwise, you may remove your bandages 72 hours after surgery, and you may shower 08/19/13. You may have steri-strips (small white skin tapes) in place directly over the incision.  These strips should be left on the skin for 14 days.  If your surgeon used Dermabond (skin glue) on the incision, you may shower in 24 hours.  The glue will flake off over the next 2-3 weeks.  8. If your port is left accessed at the end of surgery (needle left in port), the dressing cannot get wet and should only by changed by a healthcare professional. When the port is no  longer accessed (when the needle has been removed), follow step 7.   9. ACTIVITIES:  Limit activity involving your arms for the next 72 hours. Do no strenuous exercise or activity for 1 week. You may drive when you are no longer taking prescription pain medication, you can comfortably wear a seatbelt, and you can maneuver your car. 10.You  need to see your doctor in the office for a follow-up appointment in 3 weeks.  Call [phone] to make the appt. 11.When you receive a new Port-a-Cath, you will get a product guide and        ID card.  Please keep them in case you need them.  WHEN TO CALL YOUR DOCTOR [phone]): 1. Fever over 101.0 2. Chills 3. Continued bleeding from incision 4. Increased redness and tenderness at the site 5. Shortness of breath, difficulty breathing   The clinic staff is available to answer your questions during regular business hours. Please dont hesitate to call and ask to speak to one of the nurses or medical assistants for clinical concerns. If you have a medical emergency, go to the nearest emergency room or call 911.  A surgeon from [facility] is always on call at the hospital.     For further information, please visit [email or web address]

## 2013-08-18 NOTE — Anesthesia Preprocedure Evaluation (Addendum)
Anesthesia Evaluation  Patient identified by MRN, date of birth, ID band Patient awake    Reviewed: Allergy & Precautions, H&P , NPO status   History of Anesthesia Complications (+) history of anesthetic complications  Airway Mallampati: II TM Distance: >3 FB Neck ROM: Full    Dental  (+) Teeth Intact, Dental Advisory Given   Pulmonary neg pulmonary ROS,  breath sounds clear to auscultation        Cardiovascular negative cardio ROS  Rhythm:Regular Rate:Normal     Neuro/Psych Anxiety negative neurological ROS     GI/Hepatic Colon ca    Endo/Other    Renal/GU      Musculoskeletal   Abdominal   Peds  Hematology   Anesthesia Other Findings   Reproductive/Obstetrics                         Anesthesia Physical Anesthesia Plan  ASA: II  Anesthesia Plan: MAC   Post-op Pain Management:    Induction: Intravenous  Airway Management Planned: Natural Airway and Simple Face Mask  Additional Equipment:   Intra-op Plan:   Post-operative Plan: Extubation in OR  Informed Consent: I have reviewed the patients History and Physical, chart, labs and discussed the procedure including the risks, benefits and alternatives for the proposed anesthesia with the patient or authorized representative who has indicated his/her understanding and acceptance.   Dental advisory given  Plan Discussed with: CRNA and Surgeon  Anesthesia Plan Comments:         Anesthesia Quick Evaluation

## 2013-08-18 NOTE — Anesthesia Postprocedure Evaluation (Signed)
  Anesthesia Post-op Note  Patient: Luke Miller  Procedure(s) Performed: Procedure(s): INSERTION PORT-A-CATH ULTRA SOUND GUIDED (Right)  Patient Location: PACU  Anesthesia Type:MAC  Level of Consciousness: awake and alert   Airway and Oxygen Therapy: Patient Spontanous Breathing  Post-op Pain: mild  Post-op Assessment: Post-op Vital signs reviewed  Post-op Vital Signs: stable  Last Vitals:  Filed Vitals:   08/18/13 1424  BP: 118/76  Pulse: 51  Temp:   Resp: 15    Complications: No apparent anesthesia complications

## 2013-08-18 NOTE — Transfer of Care (Signed)
Immediate Anesthesia Transfer of Care Note  Patient: Luke Miller  Procedure(s) Performed: Procedure(s): INSERTION PORT-A-CATH ULTRA SOUND GUIDED (Right)  Patient Location: PACU  Anesthesia Type:MAC  Level of Consciousness: awake, alert  and oriented  Airway & Oxygen Therapy: Patient Spontanous Breathing and Patient connected to nasal cannula oxygen  Post-op Assessment: Report given to PACU RN and Post -op Vital signs reviewed and stable  Post vital signs: Reviewed and stable  Complications: No apparent anesthesia complications

## 2013-08-18 NOTE — H&P (Signed)
Luke Miller is an 48 y.o. male.   Chief Complaint:   Here for Port-a-cath insertion HPI: He is s/p partial colectomy for left colon cancer and needs long-term venous access for chemotherapy. He has been doing well postop.  Eating well.  Bowels moving well.  Past Medical History  Diagnosis Date  . Compression fracture     vertebrae  . Anxiety   . Colon cancer     dx 07/21/13   . Anemia   . Complication of anesthesia     can be combative when waking up    Past Surgical History  Procedure Laterality Date  . Spine surgery  7/13    T-12  . Colonoscopy N/A 07/20/2013    Procedure: COLONOSCOPY;  Surgeon: Luke Silence, MD;  Location: [facility];  Service: Endoscopy;  Laterality: N/A;  . Laparoscopic partial colectomy N/A 07/24/2013    Procedure: LAPAROSCOPIC ASSISTED NEAR SUBTOTAL COLECTOMY WITH INCIDENTAL APPENDECTOMY;  Surgeon: Luke Hollingshead, MD;  Location: [facility];  Service: General;  Laterality: N/A;  . Fracture surgery      right hand  . Tonsillectomy      Family History  Problem Relation Age of Onset  . Cancer Maternal Grandmother     lung  . Cancer Maternal Grandfather     colon   Social History:  reports that he has never smoked. His smokeless tobacco use includes Snuff. He reports that he does not drink alcohol or use illicit drugs.  Allergies:  Allergies  Allergen Reactions  . Dilaudid [Hydromorphone] Other (See Comments)    HALLUCINATIONS     Medications Prior to Admission  Medication Sig Dispense Refill  . ALPRAZolam (XANAX) 1 MG tablet Take 1 tablet (1 mg total) by mouth 3 (three) times daily as needed for anxiety.  90 tablet  0  . ondansetron (ZOFRAN) 4 MG tablet Take 1 tablet (4 mg total) by mouth every 6 (six) hours as needed for nausea.  45 tablet  0  . Oxycodone HCl 20 MG TABS Take 20 mg by mouth every 4 (four) hours as needed (pain).      Marland Kitchen testosterone cypionate (DEPO-TESTOSTERONE) 200 MG/ML injection Inject 1 mL (200 mg total) into the muscle every  14 (fourteen) days.  10 mL  2    Results for orders placed during the hospital encounter of 08/18/13 (from the past 48 hour(s))  CBC     Status: Abnormal   Collection Time    08/18/13 10:03 AM      Result Value Ref Range   WBC 3.9 (*) 4.0 - 10.5 K/uL   RBC 4.62  4.22 - 5.81 MIL/uL   Hemoglobin 11.1 (*) 13.0 - 17.0 g/dL   HCT 35.1 (*) 39.0 - 52.0 %   MCV 76.0 (*) 78.0 - 100.0 fL   MCH 24.0 (*) 26.0 - 34.0 pg   MCHC 31.6  30.0 - 36.0 g/dL   RDW 14.7  11.5 - 15.5 %   Platelets 216  150 - 400 K/uL  BASIC METABOLIC PANEL     Status: Abnormal   Collection Time    08/18/13 10:03 AM      Result Value Ref Range   Sodium 143  137 - 147 mEq/L   Potassium 4.6  3.7 - 5.3 mEq/L   Chloride 105  96 - 112 mEq/L   CO2 28  19 - 32 mEq/L   Glucose, Bld 88  70 - 99 mg/dL   BUN 8  6 - 23 mg/dL  Creatinine, Ser 1.03  0.50 - 1.35 mg/dL   Calcium 9.0  8.4 - 10.5 mg/dL   GFR calc non Af Amer 84 (*) >90 mL/min   GFR calc Af Amer >90  >90 mL/min   Comment: (NOTE)     The eGFR has been calculated using the CKD EPI equation.     This calculation has not been validated in all clinical situations.     eGFR's persistently <90 mL/min signify possible Chronic Kidney     Disease.   No results found.  Review of Systems  Constitutional: Negative for fever and chills.  Gastrointestinal: Negative for nausea and vomiting.    Blood pressure 132/88, pulse 53, temperature 98.5 F (36.9 C), temperature source Oral, resp. rate 16, height _0  (1.803 m), weight 207 lb 2 oz (93.951 kg), SpO2 100.00%. Physical Exam  Constitutional: He appears well-developed and well-nourished. No distress.  HENT:  Head: Normocephalic and atraumatic.  Cardiovascular: Normal rate and regular rhythm.   Respiratory: Effort normal and breath sounds normal.  GI: Soft.  Incisions are clean and intact.  Musculoskeletal: He exhibits no edema.     Assessment/Plan Colon cancer.  Plan:  US guided Port-a-cath insertion.  The  procedure risks and aftercare been explained. Risks include but are not limited to bleeding, infection, malfunction, pneumothorax, wound problems, DVT.  Luke Miller Luke Miller 08/18/2013, 12:05 PM

## 2013-08-21 ENCOUNTER — Encounter (HOSPITAL_COMMUNITY): Payer: Self-pay | Admitting: General Surgery

## 2013-08-22 ENCOUNTER — Other Ambulatory Visit: Payer: Self-pay

## 2013-08-22 MED ORDER — ALPRAZOLAM 1 MG PO TABS: ORAL_TABLET | ORAL | Status: DC

## 2013-08-22 NOTE — Telephone Encounter (Signed)
lst seen 08/02/13  DWM  If approved route to nurse to call into [facility]  [phone]

## 2013-08-22 NOTE — Telephone Encounter (Signed)
Refill authorization called in to pharmacy.

## 2013-08-28 ENCOUNTER — Telehealth: Payer: Self-pay | Admitting: Genetic Counselor

## 2013-08-28 NOTE — Telephone Encounter (Signed)
S/W PATIENT WIFE AND GAVE GENETIC APPT FOR 05/27 @ 3:30 W/GENETIC COUNSELOR WELCOME PACKET MAILED.

## 2013-08-30 ENCOUNTER — Ambulatory Visit (INDEPENDENT_AMBULATORY_CARE_PROVIDER_SITE_OTHER): Payer: BC Managed Care – PPO | Admitting: General Surgery

## 2013-08-30 ENCOUNTER — Encounter (INDEPENDENT_AMBULATORY_CARE_PROVIDER_SITE_OTHER): Payer: Self-pay | Admitting: General Surgery

## 2013-08-30 VITALS — BP 128/80 | HR 72 | Temp 97.6°F | Resp 14 | Ht 71.0 in | Wt 211.0 lb

## 2013-08-30 DIAGNOSIS — Z4889 Encounter for other specified surgical aftercare: Secondary | ICD-10-CM

## 2013-08-30 NOTE — Patient Instructions (Signed)
1 week from today, May resume usual activities as we discussed.

## 2013-08-30 NOTE — Progress Notes (Signed)
Procedure:  Near subtotal colectomy  Date:  07/24/13  Pathology:  T3N1  History:  He is here for a postoperative visit. He has started his chemotherapy. He is eating okay. Bowels are working.  Exam: General- Is in NAD. Chest-Port-A-Cath incision is clean and intact.  Abdomen-incisions are clean and intact. There is an area of the midline incision inferiorly that is irritated by his underwear and I applied a bandage to this.  Assessment:  Status post near subtotal colectomy for left colon cancer-wounds are healing well.  Plan:  1 week from now, can start resuming  usual activities. Return visit one month.

## 2013-09-15 ENCOUNTER — Other Ambulatory Visit: Payer: Self-pay

## 2013-09-15 MED ORDER — ALPRAZOLAM 1 MG PO TABS: ORAL_TABLET | ORAL | Status: DC

## 2013-09-15 NOTE — Telephone Encounter (Signed)
loast seen 08/02/13  DWM  If approved route to nurse to call into [facility]  [phone]

## 2013-09-15 NOTE — Telephone Encounter (Signed)
This is okay to refill 

## 2013-09-15 NOTE — Telephone Encounter (Signed)
rx called into pharmacy

## 2013-09-26 ENCOUNTER — Encounter (INDEPENDENT_AMBULATORY_CARE_PROVIDER_SITE_OTHER): Payer: BC Managed Care – PPO | Admitting: General Surgery

## 2013-10-04 ENCOUNTER — Ambulatory Visit (HOSPITAL_BASED_OUTPATIENT_CLINIC_OR_DEPARTMENT_OTHER): Payer: BC Managed Care – PPO | Admitting: Genetic Counselor

## 2013-10-04 ENCOUNTER — Encounter: Payer: Self-pay | Admitting: Genetic Counselor

## 2013-10-04 ENCOUNTER — Encounter (INDEPENDENT_AMBULATORY_CARE_PROVIDER_SITE_OTHER): Payer: BC Managed Care – PPO | Admitting: General Surgery

## 2013-10-04 ENCOUNTER — Other Ambulatory Visit: Payer: BC Managed Care – PPO

## 2013-10-04 DIAGNOSIS — Z801 Family history of malignant neoplasm of trachea, bronchus and lung: Secondary | ICD-10-CM

## 2013-10-04 DIAGNOSIS — C189 Malignant neoplasm of colon, unspecified: Secondary | ICD-10-CM

## 2013-10-04 DIAGNOSIS — Z800 Family history of malignant neoplasm of digestive organs: Secondary | ICD-10-CM

## 2013-10-04 DIAGNOSIS — Z809 Family history of malignant neoplasm, unspecified: Secondary | ICD-10-CM

## 2013-10-04 NOTE — Progress Notes (Signed)
Patient Name: Luke Miller Patient Age: 48 y.o. Encounter Date: 10/04/2013  Referring Physician: Jackolyn Confer, MD  Primary Care Provider: [name], MD   [name], a 48 y.o. male, is being seen at the [facility] due to a personal and family history of colon cancer.  He presents to clinic today with his wife, Luke Miller, to discuss the possibility of a hereditary predisposition to cancer and discuss whether genetic testing is warranted.  HISTORY OF PRESENT ILLNESS: [name] was diagnosed with colon cancer recently at the age of 36. He is s/p left colectomy and will be receiving chemotherapy. Tumor testing showed microsatellite stable (MSS) and normal protein expression for Lynch-associated proteins.   He has no other history of cancer.   Past Surgical History  Procedure Laterality Date  . Spine surgery  7/13    T-12  . Colonoscopy N/A 07/20/2013    Procedure: COLONOSCOPY;  Surgeon: Arta Silence, MD;  Location: [facility];  Service: Endoscopy;  Laterality: N/A;  . Laparoscopic partial colectomy N/A 07/24/2013    Procedure: LAPAROSCOPIC ASSISTED NEAR SUBTOTAL COLECTOMY WITH INCIDENTAL APPENDECTOMY;  Surgeon: [name], MD;  Location: [facility];  Service: General;  Laterality: N/A;  . Fracture surgery      right hand  . Tonsillectomy    . Portacath placement Right 08/18/2013    Procedure: INSERTION PORT-A-CATH ULTRA SOUND GUIDED;  Surgeon: [name], MD;  Location: [facility];  Service: General;  Laterality: Right;    History   Social History  . Marital Status: Married    Spouse Name: N/A    Number of Children: N/A  . Years of Education: N/A   Social History Main Topics  . Smoking status: Never Smoker   . Smokeless tobacco: Current User    Types: Snuff  . Alcohol Use: No  . Drug Use: No  . Sexual Activity: Yes   Other Topics Concern  . Not on file   Social History Narrative  . No narrative on file     FAMILY HISTORY:   During  the visit, a 4-generation pedigree was obtained. Significant diagnoses include the following:  Family History  Problem Relation Age of Onset  . Lung cancer Maternal Grandmother     deceased 59; smoker  . Colon cancer Maternal Grandfather 15    deceased 62  . Other Mother     TAH/BSO in 19s  . Cancer Maternal Aunt     possibly had uterine cancer; unconfirmed  . Cancer Maternal Uncle     cancer of the eye; unsure what type; dx 76s    Additionally, he has a son (age 42) and one full sister (age 55). He has a maternal half-sister (age 15). Both sister had negative colonoscpic screenings recently. There are no relevant cancers reported in his paternal family.  [name] ancestry is English. There is no known Jewish ancestry and no consanguinity.  ASSESSMENT AND PLAN: [name] is a 48 y.o. male with a personal and family history of colon cancer as noted above. His tumor was microsatellite stable (MSS) and showed normal protein expression for Lynch-associated proteins (hMLH1, hMSH2, hMSH6, and hPMS2). Given this, he does not have a high likelihood of having Lynch syndrome, but we explained that tumor testing is a screening rather than diagnostic test. Also, testing of other genes associated with hereditary colon cancer is indicated given his family history. We reviewed the characteristics, features and inheritance patterns of hereditary cancer syndromes. We also discussed genetic  testing, including the process of testing, insurance coverage and implications of results. A negative test would be reassuring for him and his family.  [name] wished to pursue genetic testing and a blood sample will be sent to [facility] for analysis of 17 genes on the ColoNext panel. We discussed the implications of a positive, negative and/ or Variant of Uncertain Significance (VUS) result. Results should be available in approximately 5-6 weeks, at which point we will contact him and address implications  for him as well as address genetic testing for at-risk family members, if needed.    We encouraged [name] to remain in contact with Cancer Genetics annually so that we can update the family history and inform him of any changes in cancer genetics and testing that may be of benefit for this family. Mr.  Capps's questions were answered to his satisfaction today.   Thank you for the referral and allowing [place] to share in the care of your patient.   The patient was seen for a total of 30 minutes, greater than 50% of which was spent face-to-face counseling. This patient was discussed with the overseeing provider who agrees with the above.

## 2013-10-10 ENCOUNTER — Encounter (INDEPENDENT_AMBULATORY_CARE_PROVIDER_SITE_OTHER): Payer: Self-pay | Admitting: General Surgery

## 2013-10-10 ENCOUNTER — Ambulatory Visit (INDEPENDENT_AMBULATORY_CARE_PROVIDER_SITE_OTHER): Payer: BC Managed Care – PPO | Admitting: General Surgery

## 2013-10-10 VITALS — BP 144/80 | HR 74 | Temp 97.6°F | Ht 71.0 in | Wt 214.0 lb

## 2013-10-10 DIAGNOSIS — Z4889 Encounter for other specified surgical aftercare: Secondary | ICD-10-CM

## 2013-10-10 NOTE — Patient Instructions (Signed)
Call if you have any problems with the incision.

## 2013-10-10 NOTE — Progress Notes (Signed)
Procedure:  Near subtotal colectomy  Date:  07/24/13  Pathology:  T3N1  History:  He is here for her second postoperative visit. He is tolerating his chemotherapy fairly well. He does get some leukopenia from it. Bowels are working well. No abdominal pain. No problems with the incision.  Exam: General- Is in NAD.  Abdomen-Midline incision is clean, intact, and solid with no evidence of hernia.  Assessment:  Status post near subtotal colectomy for left colon cancer-Doing well overall.  Plan:  Return prn.

## 2013-10-11 ENCOUNTER — Other Ambulatory Visit: Payer: Self-pay

## 2013-10-11 NOTE — Telephone Encounter (Signed)
Last seen 08/02/13  DWM  If approved route to nurse and call into [facility] [phone]

## 2013-10-11 NOTE — Telephone Encounter (Signed)
This may be refilled on Monday, please encourage the patient to try to take less medication and future prescriptions we will probably go with 0.5   3-4 times daily if needed,  #120

## 2013-10-17 NOTE — Telephone Encounter (Signed)
rx called into pharmacy

## 2013-11-09 ENCOUNTER — Other Ambulatory Visit: Payer: Self-pay

## 2013-11-09 MED ORDER — ALPRAZOLAM 1 MG PO TABS: ORAL_TABLET | ORAL | Status: AC

## 2013-11-09 NOTE — Telephone Encounter (Signed)
Last seen 08/02/13  DWM  If approved route to nurse to call into [facility]  [phone]

## 2013-11-09 NOTE — Telephone Encounter (Signed)
Called in.

## 2013-11-09 NOTE — Telephone Encounter (Signed)
This is okay to refill 

## 2013-11-13 ENCOUNTER — Encounter: Payer: Self-pay | Admitting: Genetic Counselor

## 2013-11-13 NOTE — Progress Notes (Signed)
Referring Provider: Jackolyn Confer, MD   Mr. Silveria was seen in the [place] clinic on 10/04/13 due to a personal and family history of cancer and concern regarding a hereditary predisposition to cancer in the family. Please refer to the prior Genetics clinic note for more information regarding Mr. Schraeder's medical and family histories and our assessment at the time.   GENETIC TESTING: At the time of Mr. Vanderpool's visit, we recommended he pursue genetic testing of the multiple genes on the [employer] gene panel. This test, which included sequencing and deletion/duplication analysis of 17 genes, was performed at [facility]. 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 The genes tested were APC, BMPR1A, CDH1, CHEK2, EPCAM, GREM1, MLH1, MSH2, MSH6, MUTYH, PMS2, POLD1, POLE, PTEN, SMAD4, STK11, and TP53.  We discussed that this is one of the two founder mutations in the [facility] gene that are present in 1-2% of individuals of [place] descent. If an individual inherits two pathogenic MYH mutations, they have a recessive form of hereditary colonic polyposis. There is conflicting data regarding whether a single MYH mutation confers a moderate increased risk (up to 2-fold) for colorectal cancer. We discussed that this result does not necessarily explain Mr. Mckenny's cancer diagnosis and should not be over interpreted.   MEDICAL MANAGEMENT: We emphasized that being a carrier of a single MYH mutaton is not thought to significantly increase cancer risk, especially in men. Mr. Iyengar should continue to follow cancer screening guidelines provided by his overseeing providers.   FAMILY MEMBERS: It is important that Mr. Gripp informs his relatives of these results, but it is not necessarily recommended that they pursue testing themselves. They are recommended to speak with a genetic counselor prior to any  testing. A genetic counselor can be located at [email or web address]. Mr. Nafziger son has a 50% chance to have inherited this mutation. However, this is of no consequence to him at this time. We discussed that one option would be for his wife to pursue testing to see whether she is a carrier of an MYH mutation as well. Only then would their son be at risk of having MYH-associated polyposis.  We encouraged Mr. Compean to remain in contact with [place] on an annual basis so we can update his personal and family histories, and let him know of advances in cancer genetics that may benefit the family. Our contact number was provided. Mr. Gosney's questions were answered to his satisfaction today, and he knows he is welcome to call anytime with additional questions.    Steele Berg, MS, [place] Certified Genetic Counseor phone: [phone] [email or web address]

## 2013-11-16 ENCOUNTER — Other Ambulatory Visit: Payer: Self-pay | Admitting: *Deleted

## 2013-11-16 MED ORDER — TESTOSTERONE CYPIONATE 200 MG/ML IM SOLN: 200.0000 mg | INTRAMUSCULAR | Status: DC

## 2014-03-08 ENCOUNTER — Ambulatory Visit (INDEPENDENT_AMBULATORY_CARE_PROVIDER_SITE_OTHER): Payer: BC Managed Care – PPO | Admitting: Nurse Practitioner

## 2014-03-08 ENCOUNTER — Encounter: Payer: Self-pay | Admitting: Nurse Practitioner

## 2014-03-08 VITALS — BP 144/92 | HR 109 | Temp 97.8°F | Ht 71.0 in | Wt 214.0 lb

## 2014-03-08 DIAGNOSIS — F13939 Sedative, hypnotic or anxiolytic use, unspecified with withdrawal, unspecified: Secondary | ICD-10-CM

## 2014-03-08 DIAGNOSIS — F13239 Sedative, hypnotic or anxiolytic dependence with withdrawal, unspecified: Secondary | ICD-10-CM

## 2014-03-08 DIAGNOSIS — R0602 Shortness of breath: Secondary | ICD-10-CM

## 2014-03-08 NOTE — Patient Instructions (Signed)
Benzodiazepine Withdrawal  °Benzodiazepines are a group of drugs that are prescribed for both short-term and long-term treatment of a variety of medical conditions. For some of these conditions, such as seizures and sudden and severe muscle spasms, they are used only for a few hours or a few days. For other conditions, such as anxiety, sleep problems, or frequent muscle spasms or to help prevent seizures, they are used for an extended period, usually weeks or months. °Benzodiazepines work by changing the way your brain functions. Normally, chemicals in your brain called neurotransmitters send messages between your brain cells. The neurotransmitter that benzodiazepines affect is called gamma-aminobutyric acid (GABA). GABA sends out messages that have a calming effect on many of the functions of your brain. Benzodiazepines make these messages stronger and increase this calming effect. °Short-term use of benzodiazepines usually does not cause problems when you stop taking the drugs. However, if you take benzodiazepines for a long time, your body can adjust to the drug and require more of it to produce the same effect (drug tolerance). Eventually, you can develop physical dependence on benzodiazepines, which is when you experience negative effects if your dosage of benzodiazepines is reduced or stopped too quickly. These negative effects are called symptoms of withdrawal. °SYMPTOMS °Symptoms of withdrawal may begin anytime within the first 10 days after you stop taking the benzodiazepine. They can last from several weeks up to a few months but usually are the worst between the first 10 to 14 days.  °The actual symptoms also vary, depending on the type of benzodiazepine you take. Possible symptoms include: °· Anxiety. °· Excitability. °· Irritability. °· Depression. °· Mood swings. °· Trouble sleeping. °· Confusion. °· Uncontrollable shaking (tremors). °· Muscle weakness. °· Seizures. °DIAGNOSIS °To diagnose  benzodiazepine withdrawal, your caregiver will examine you for certain signs, such as: °· Rapid heartbeat. °· Rapid breathing. °· Tremors. °· High blood pressure. °· Fever. °· Mood changes. °Your caregiver also may ask the following questions about your use of benzodiazepines: °· What type of benzodiazepine did you take? °· How much did you take each day? °· How long did you take the drug? °· When was the last time you took the drug? °· Do you take any other drugs? °· Have you had alcohol recently? °· Have you had a seizure recently? °· Have you lost consciousness recently? °· Have you had trouble remembering recent events? °· Have you had a recent increase in anxiety, irritability, or trouble sleeping? °A drug test also may be administered. °TREATMENT °The treatment for benzodiazepine withdrawal can vary, depending on the type and severity of your symptoms, what type of benzodiazepine you have been taking, and how long you have been taking the benzodiazepine. Sometimes it is necessary for you to be treated in a hospital, especially if you are at risk of seizures.  °Often, treatment includes a prescription for a long-acting benzodiazepine, the dosage of which is reduced slowly over a long period. This period could be several weeks or months. Eventually, your dosage will be reduced to a point that you can stop taking the drug, without experiencing withdrawal symptoms. This is called tapered withdrawal. Occasionally, minor symptoms of withdrawal continue for a few days or weeks after you have completed a tapered withdrawal. °SEEK IMMEDIATE MEDICAL CARE IF: °· You have a seizure. °· You develop a craving for drugs or alcohol. °· You begin to experience symptoms of withdrawal during your tapered withdrawal. °· You become very confused. °· You lose consciousness. °· You   have trouble breathing. °· You think about hurting yourself or someone else. °Document Released: 04/16/2011 Document Revised: 07/20/2011 Document  Reviewed: 04/16/2011 °ExitCare® Patient Information ©2015 [employer]. This information is not intended to replace advice given to you by your health care provider. Make sure you discuss any questions you have with your health care provider. ° °

## 2014-03-08 NOTE — Progress Notes (Signed)
   Subjective:    Patient ID: Luke Miller, male    DOB: [birth year], 48 y.o.   MRN: [id number]  HPI Patient in today c/o jitteriness and unable to rest - He took to many xanax and ran out to early- thinks 5 days to early and has not had any meds- He was taken to the ER on Sunday and they treated him for dehydration and sent him home. He was on xanax 1mg  TID and has had none for 5 days.    Review of Systems  Constitutional: Negative.   HENT: Negative.   Eyes: Negative.   Respiratory: Negative.   Cardiovascular: Negative.   Gastrointestinal: Negative.   Genitourinary: Negative.   Neurological: Negative.   Psychiatric/Behavioral: Negative.   All other systems reviewed and are negative.      Objective:   Physical Exam  Constitutional: He is oriented to person, place, and time. He appears well-developed and well-nourished.  Cardiovascular: Normal rate, regular rhythm and normal heart sounds.   Pulmonary/Chest: Effort normal and breath sounds normal.  Neurological: He is alert and oriented to person, place, and time.  Skin: Skin is warm and dry.  Psychiatric: He has a normal mood and affect. His behavior is normal. Judgment and thought content normal.   BP 144/92  Pulse 109  Temp(Src) 97.8 F (36.6 C) (Oral)  Ht 5\' 11"  (1.803 m)  Wt 214 lb (97.07 kg)  BMI 29.86 kg/m2  SpO2 97%  Chest xray - no changes-Preliminary reading by Ronnald Collum, FNP  [facility]        Assessment & Plan:    1. SOB (shortness of breath)   2. Withdrawal from sedative, hypnotic, or anxiolytic drug    Put back on xanax 1mg  TID- wife picked up rx today Force fluids  Rest RTO prn  Mary-Margaret Hassell Done, FNP

## 2014-05-31 ENCOUNTER — Encounter (INDEPENDENT_AMBULATORY_CARE_PROVIDER_SITE_OTHER): Payer: Self-pay

## 2014-08-15 ENCOUNTER — Other Ambulatory Visit: Payer: Self-pay | Admitting: *Deleted

## 2014-08-15 MED ORDER — TESTOSTERONE CYPIONATE 200 MG/ML IM SOLN: 200.0000 mg | INTRAMUSCULAR | Status: DC

## 2014-09-07 ENCOUNTER — Other Ambulatory Visit (INDEPENDENT_AMBULATORY_CARE_PROVIDER_SITE_OTHER): Payer: BLUE CROSS/BLUE SHIELD

## 2014-09-07 DIAGNOSIS — R5382 Chronic fatigue, unspecified: Secondary | ICD-10-CM | POA: Diagnosis not present

## 2014-09-07 NOTE — Progress Notes (Signed)
Lab only 

## 2014-12-06 ENCOUNTER — Other Ambulatory Visit (INDEPENDENT_AMBULATORY_CARE_PROVIDER_SITE_OTHER): Payer: BLUE CROSS/BLUE SHIELD

## 2014-12-06 ENCOUNTER — Other Ambulatory Visit: Payer: Self-pay | Admitting: Family

## 2014-12-06 ENCOUNTER — Other Ambulatory Visit: Payer: Self-pay | Admitting: Oncology

## 2014-12-06 ENCOUNTER — Encounter (INDEPENDENT_AMBULATORY_CARE_PROVIDER_SITE_OTHER): Payer: Self-pay

## 2014-12-06 DIAGNOSIS — C189 Malignant neoplasm of colon, unspecified: Secondary | ICD-10-CM | POA: Diagnosis not present

## 2014-12-06 DIAGNOSIS — E611 Iron deficiency: Secondary | ICD-10-CM

## 2014-12-06 LAB — CBC WITH DIFFERENTIAL/PLATELET
BASOS ABS: 0 10*3/uL (ref 0.0–0.1)
BASOS PCT: 0 % (ref 0–1)
EOS ABS: 0.2 10*3/uL (ref 0.0–0.7)
Eosinophils Relative: 3 % (ref 0–5)
HCT: 44.3 % (ref 39.0–52.0)
Hemoglobin: 15.2 g/dL (ref 13.0–17.0)
Lymphocytes Relative: 23 % (ref 12–46)
Lymphs Abs: 1.2 10*3/uL (ref 0.7–4.0)
MCH: 29.7 pg (ref 26.0–34.0)
MCHC: 34.3 g/dL (ref 30.0–36.0)
MCV: 86.7 fL (ref 78.0–100.0)
MONO ABS: 0.4 10*3/uL (ref 0.1–1.0)
MPV: 9.1 fL (ref 8.6–12.4)
Monocytes Relative: 8 % (ref 3–12)
Neutro Abs: 3.4 10*3/uL (ref 1.7–7.7)
Neutrophils Relative %: 66 % (ref 43–77)
Platelets: 156 10*3/uL (ref 150–400)
RBC: 5.11 MIL/uL (ref 4.22–5.81)
RDW: 15.1 % (ref 11.5–15.5)
WBC: 5.1 10*3/uL (ref 4.0–10.5)

## 2014-12-06 LAB — IRON AND TIBC
%SAT: 21 % (ref 20–55)
IRON: 75 ug/dL (ref 42–165)
TIBC: 357 ug/dL (ref 215–435)
UIBC: 282 ug/dL (ref 125–400)

## 2014-12-06 LAB — COMPLETE METABOLIC PANEL WITH GFR
ALT: 17 U/L (ref 9–46)
AST: 20 U/L (ref 10–35)
Albumin: 4.3 g/dL (ref 3.6–5.1)
Alkaline Phosphatase: 66 U/L (ref 40–115)
BUN: 8 mg/dL (ref 7–25)
CALCIUM: 9.4 mg/dL (ref 8.6–10.3)
CO2: 28 meq/L (ref 20–31)
CREATININE: 1 mg/dL (ref 0.70–1.33)
Chloride: 102 mEq/L (ref 98–110)
GFR, Est Non African American: 87 mL/min (ref >=60)
Glucose, Bld: 90 mg/dL (ref 70–99)
POTASSIUM: 4.6 meq/L (ref 3.5–5.3)
Sodium: 140 mEq/L (ref 135–146)
TOTAL PROTEIN: 7 g/dL (ref 6.1–8.1)
Total Bilirubin: 0.7 mg/dL (ref 0.2–1.2)

## 2014-12-06 LAB — FERRITIN: Ferritin: 66 ng/mL (ref 22–322)

## 2014-12-06 NOTE — Addendum Note (Signed)
Addended by: Earlene Plater on: 12/06/2014 02:44 PM   Modules accepted: Orders

## 2014-12-06 NOTE — Addendum Note (Signed)
Addended by: Earlene Plater on: 12/06/2014 02:41 PM   Modules accepted: Orders

## 2014-12-06 NOTE — Progress Notes (Signed)
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

## 2014-12-12 LAB — CEA: CEA: 1.7 ng/mL (ref 0.0–5.0)

## 2015-01-18 ENCOUNTER — Encounter: Payer: Self-pay | Admitting: Genetic Counselor

## 2015-01-18 DIAGNOSIS — Z1379 Encounter for other screening for genetic and chromosomal anomalies: Secondary | ICD-10-CM | POA: Insufficient documentation

## 2015-02-12 ENCOUNTER — Ambulatory Visit (INDEPENDENT_AMBULATORY_CARE_PROVIDER_SITE_OTHER): Payer: BLUE CROSS/BLUE SHIELD | Admitting: *Deleted

## 2015-02-12 DIAGNOSIS — Z23 Encounter for immunization: Secondary | ICD-10-CM

## 2015-02-12 NOTE — Progress Notes (Signed)
prevnar and fluarix given pt tolerated well

## 2015-02-12 NOTE — Patient Instructions (Signed)

## 2015-03-18 ENCOUNTER — Other Ambulatory Visit: Payer: Self-pay | Admitting: Neurosurgery

## 2015-03-18 ENCOUNTER — Ambulatory Visit (INDEPENDENT_AMBULATORY_CARE_PROVIDER_SITE_OTHER): Payer: BLUE CROSS/BLUE SHIELD

## 2015-03-18 DIAGNOSIS — M545 Low back pain: Secondary | ICD-10-CM

## 2015-03-18 DIAGNOSIS — R52 Pain, unspecified: Secondary | ICD-10-CM

## 2015-06-06 ENCOUNTER — Other Ambulatory Visit (INDEPENDENT_AMBULATORY_CARE_PROVIDER_SITE_OTHER): Payer: BLUE CROSS/BLUE SHIELD

## 2015-06-06 DIAGNOSIS — C182 Malignant neoplasm of ascending colon: Secondary | ICD-10-CM

## 2015-06-06 DIAGNOSIS — D508 Other iron deficiency anemias: Secondary | ICD-10-CM

## 2015-06-06 LAB — CEA: CEA: 1.4 ng/mL (ref 0.0–5.0)

## 2015-06-06 NOTE — Progress Notes (Signed)
Lab for dr. Tressie Stalker cea malignant neoplasm of colon and other iron deficiency anemia

## 2015-07-04 ENCOUNTER — Other Ambulatory Visit: Payer: BLUE CROSS/BLUE SHIELD

## 2015-07-04 DIAGNOSIS — E291 Testicular hypofunction: Secondary | ICD-10-CM

## 2015-07-04 DIAGNOSIS — Z800 Family history of malignant neoplasm of digestive organs: Secondary | ICD-10-CM

## 2015-07-04 LAB — CBC WITH DIFFERENTIAL/PLATELET
Basophils Absolute: 0 10*3/uL (ref 0.0–0.1)
Basophils Relative: 0 % (ref 0–1)
Eosinophils Absolute: 0.1 10*3/uL (ref 0.0–0.7)
Eosinophils Relative: 3 % (ref 0–5)
HEMATOCRIT: 42.7 % (ref 39.0–52.0)
Hemoglobin: 14.2 g/dL (ref 13.0–17.0)
LYMPHS ABS: 0.9 10*3/uL (ref 0.7–4.0)
LYMPHS PCT: 25 % (ref 12–46)
MCH: 30.2 pg (ref 26.0–34.0)
MCHC: 33.3 g/dL (ref 30.0–36.0)
MCV: 90.9 fL (ref 78.0–100.0)
MONO ABS: 0.2 10*3/uL (ref 0.1–1.0)
MONOS PCT: 6 % (ref 3–12)
MPV: 10.1 fL (ref 8.6–12.4)
Neutro Abs: 2.3 10*3/uL (ref 1.7–7.7)
Neutrophils Relative %: 66 % (ref 43–77)
Platelets: 161 10*3/uL (ref 150–400)
RBC: 4.7 MIL/uL (ref 4.22–5.81)
RDW: 14.4 % (ref 11.5–15.5)
WBC: 3.5 10*3/uL — AB (ref 4.0–10.5)

## 2015-07-04 LAB — COMPLETE METABOLIC PANEL WITH GFR
ALBUMIN: 4.2 g/dL (ref 3.6–5.1)
ALK PHOS: 46 U/L (ref 40–115)
ALT: 24 U/L (ref 9–46)
AST: 25 U/L (ref 10–35)
BILIRUBIN TOTAL: 0.7 mg/dL (ref 0.2–1.2)
BUN: 10 mg/dL (ref 7–25)
CO2: 27 mmol/L (ref 20–31)
CREATININE: 1.01 mg/dL (ref 0.70–1.33)
Calcium: 9.1 mg/dL (ref 8.6–10.3)
Chloride: 104 mmol/L (ref 98–110)
GFR, EST NON AFRICAN AMERICAN: 86 mL/min (ref >=60)
GLUCOSE: 109 mg/dL — AB (ref 70–99)
Potassium: 4.5 mmol/L (ref 3.5–5.3)
Sodium: 141 mmol/L (ref 135–146)
TOTAL PROTEIN: 6.9 g/dL (ref 6.1–8.1)

## 2015-07-05 LAB — VITAMIN D 25 HYDROXY (VIT D DEFICIENCY, FRACTURES): VIT D 25 HYDROXY: 21 ng/mL — AB (ref 30–100)

## 2015-07-05 LAB — TESTOSTERONE, FREE AND TOTAL (INCLUDES SHBG)-(MALES)
Sex Hormone Binding: 39 nmol/L (ref 10–50)
TESTOSTERONE: 109 ng/dL — AB (ref 250–827)
Testosterone, Free: 18.1 pg/mL — ABNORMAL LOW (ref 47.0–244.0)
Testosterone-% Free: 1.7 % (ref 1.6–2.9)

## 2015-08-12 ENCOUNTER — Encounter: Payer: Self-pay | Admitting: *Deleted

## 2015-08-12 ENCOUNTER — Encounter (INDEPENDENT_AMBULATORY_CARE_PROVIDER_SITE_OTHER): Payer: Self-pay

## 2015-08-20 ENCOUNTER — Ambulatory Visit (INDEPENDENT_AMBULATORY_CARE_PROVIDER_SITE_OTHER): Payer: BLUE CROSS/BLUE SHIELD | Admitting: Urology

## 2015-08-20 DIAGNOSIS — N500 Atrophy of testis: Secondary | ICD-10-CM | POA: Diagnosis not present

## 2015-08-20 DIAGNOSIS — N486 Induration penis plastica: Secondary | ICD-10-CM | POA: Diagnosis not present

## 2015-08-28 ENCOUNTER — Other Ambulatory Visit: Payer: Self-pay

## 2015-08-28 NOTE — Telephone Encounter (Signed)
Last seen 03/08/14  MMM  If approved print

## 2015-08-29 MED ORDER — TESTOSTERONE CYPIONATE 200 MG/ML IM SOLN: 200.0000 mg | INTRAMUSCULAR | Status: AC

## 2015-08-29 NOTE — Telephone Encounter (Signed)
rx ready for carlon- tell her he has not been seen since 2015 and NTBS

## 2015-08-29 NOTE — Telephone Encounter (Signed)
Pt wife aware

## 2015-09-02 ENCOUNTER — Ambulatory Visit: Payer: BLUE CROSS/BLUE SHIELD | Admitting: Nurse Practitioner

## 2015-09-09 ENCOUNTER — Other Ambulatory Visit: Payer: Self-pay | Admitting: Gastroenterology

## 2015-10-23 ENCOUNTER — Telehealth: Payer: Self-pay | Admitting: *Deleted

## 2015-10-23 MED ORDER — DOXYCYCLINE HYCLATE 100 MG PO TABS: 100.0000 mg | ORAL_TABLET | Freq: Two times a day (BID) | ORAL | Status: AC

## 2015-10-23 NOTE — Telephone Encounter (Signed)
Pt aware DOXY called in = found a deer tick today

## 2015-10-31 ENCOUNTER — Telehealth: Payer: Self-pay

## 2015-10-31 NOTE — Telephone Encounter (Signed)
Insurance authorized Testosterone injection through 10/30/16

## 2015-12-24 DIAGNOSIS — S22000A Wedge compression fracture of unspecified thoracic vertebra, initial encounter for closed fracture: Secondary | ICD-10-CM | POA: Diagnosis not present

## 2015-12-24 DIAGNOSIS — G5603 Carpal tunnel syndrome, bilateral upper limbs: Secondary | ICD-10-CM | POA: Diagnosis not present

## 2015-12-24 DIAGNOSIS — M47816 Spondylosis without myelopathy or radiculopathy, lumbar region: Secondary | ICD-10-CM | POA: Diagnosis not present

## 2016-01-08 DIAGNOSIS — N486 Induration penis plastica: Secondary | ICD-10-CM | POA: Diagnosis not present

## 2016-01-09 DIAGNOSIS — N486 Induration penis plastica: Secondary | ICD-10-CM | POA: Diagnosis not present

## 2016-02-19 DIAGNOSIS — N486 Induration penis plastica: Secondary | ICD-10-CM | POA: Diagnosis not present

## 2016-02-19 IMAGING — CT CT ABD-PELV W/O CM
2 of 4 series · 16 of 46 positions shown, 18 images · non-contrast
Comparison: none

[Series 2: abdomen/pelvis w/o contrast · axial · non-contrast · 0.82mm/px · z∈[-509,-74]mm · 13 of 95 slices shown, 15 images]
[im 4/95  soft-tissue]
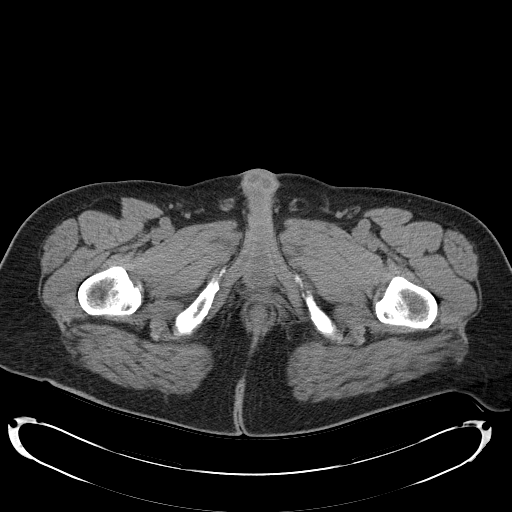
[im 4/95  bone]
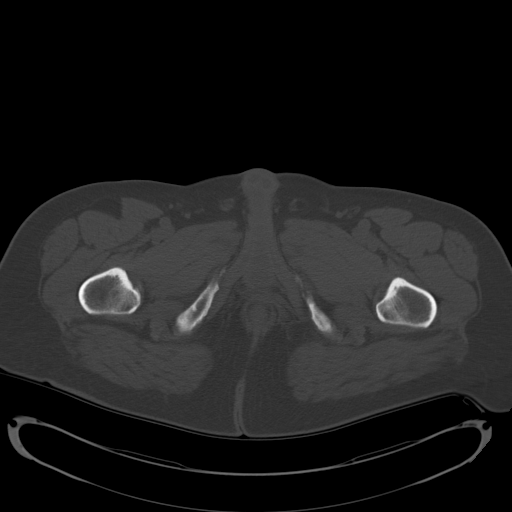
[im 12/95  soft-tissue]
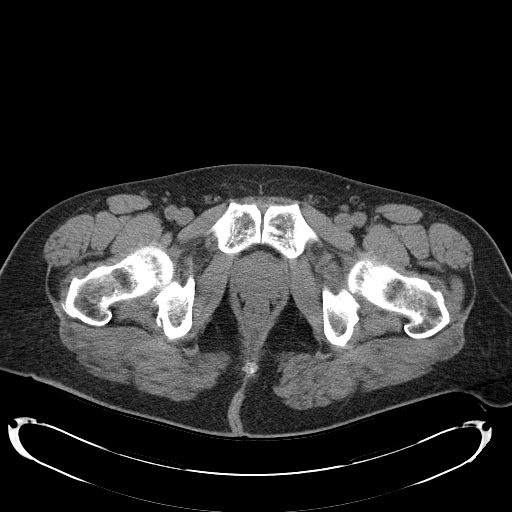
[im 19/95  soft-tissue]
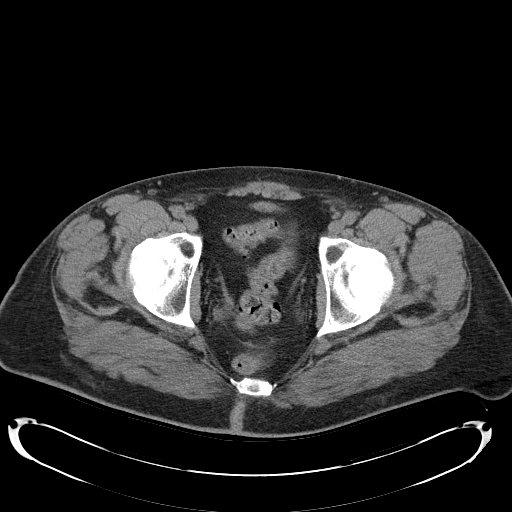
[im 27/95  soft-tissue]
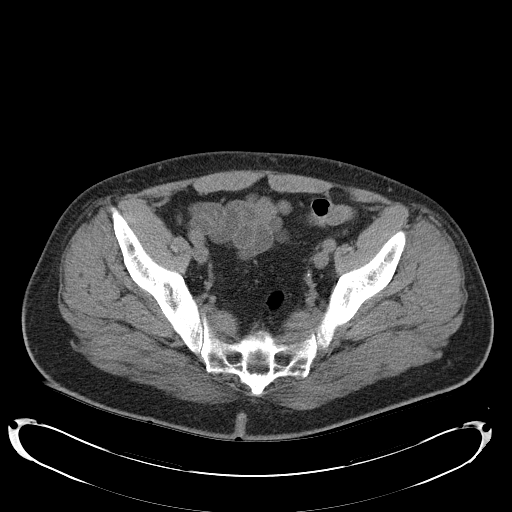
[im 34/95  soft-tissue]
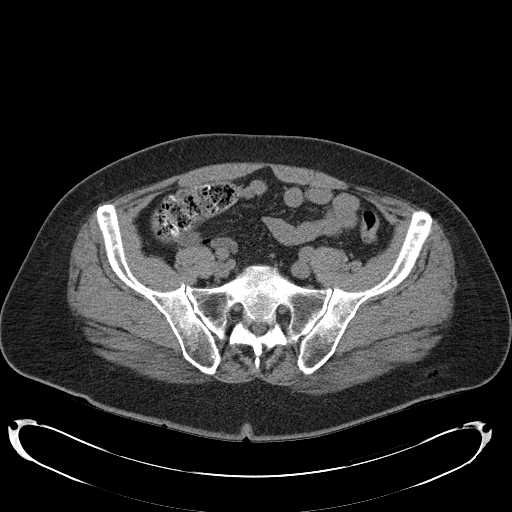
[im 42/95  soft-tissue]
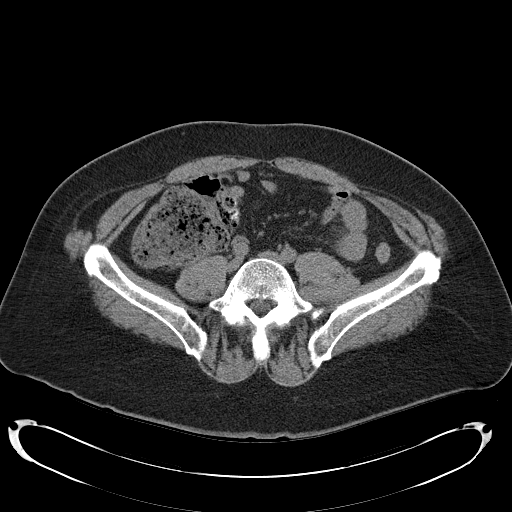
[im 49/95  soft-tissue]
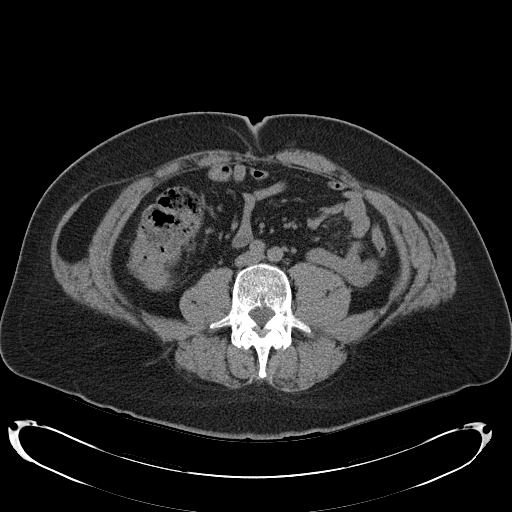
[im 53/95  soft-tissue]
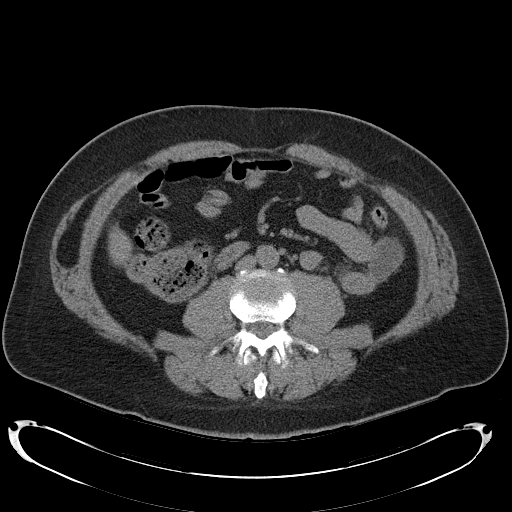
[im 61/95  soft-tissue]
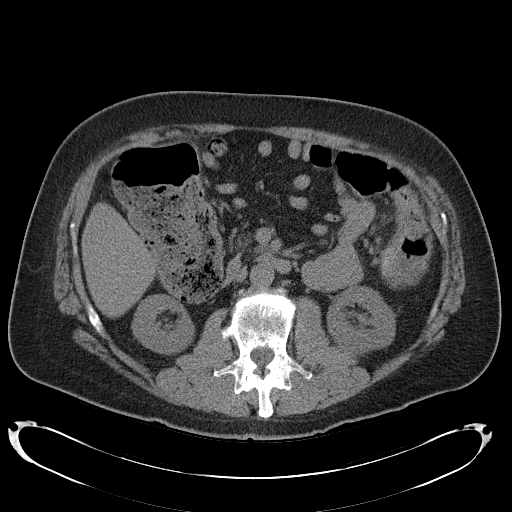
[im 61/95  bone]
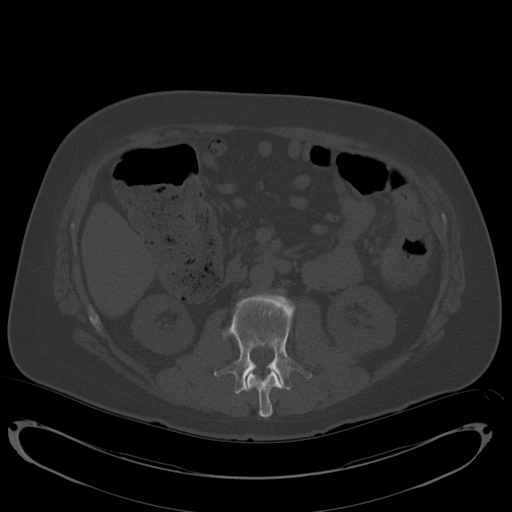
[im 68/95  soft-tissue]
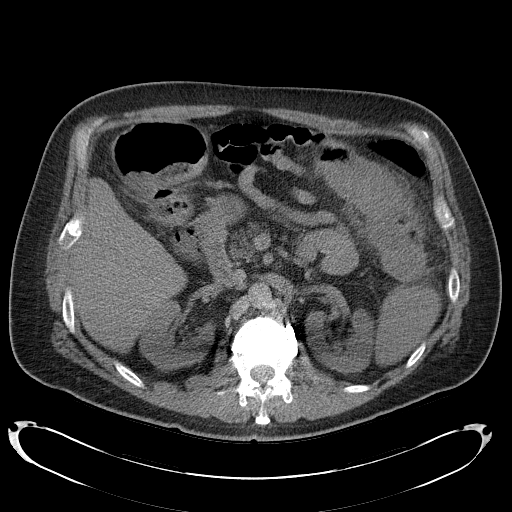
[im 76/95  soft-tissue]
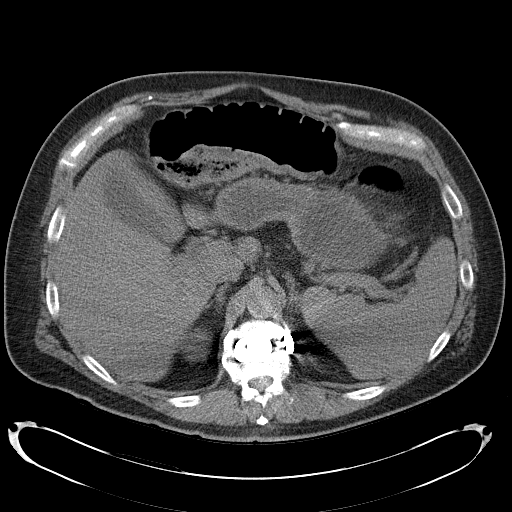
[im 83/95  soft-tissue]
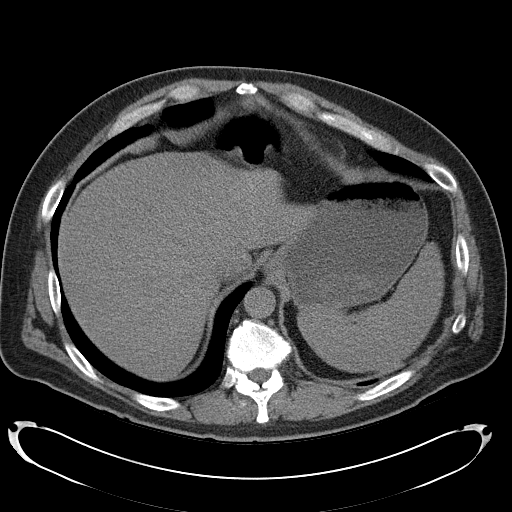
[im 91/95  soft-tissue]
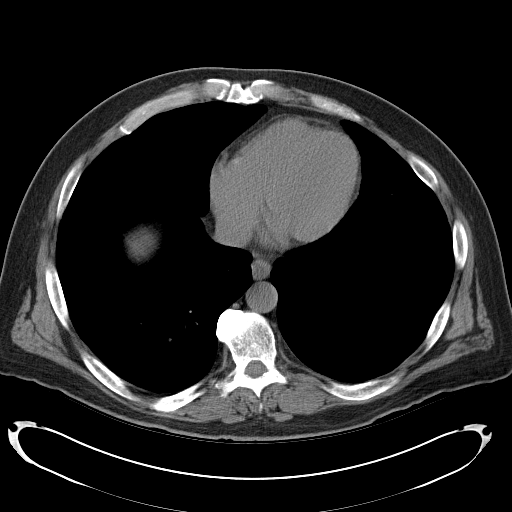

[Series 4: mpr cor (id) · coronal · 0.79mm/px · 3 of 98 slices shown]
[im 33/98  soft-tissue]
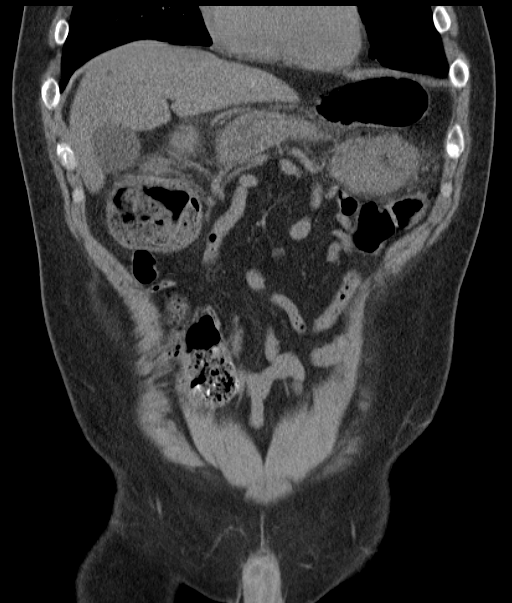
[im 44/98  soft-tissue]
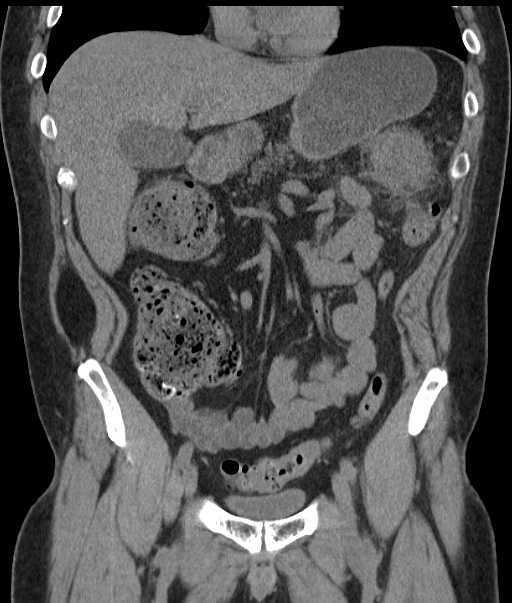
[im 54/98  soft-tissue]
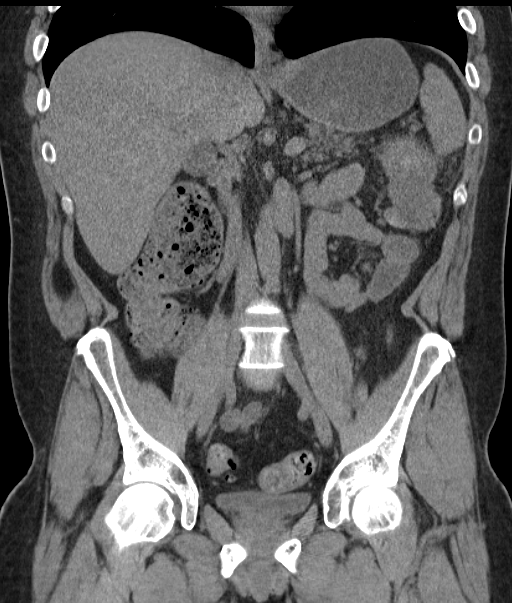

[16 of 46 positions shown; findings below may reference images not displayed]

CLINICAL DATA
Abdominal pain and cramping, weight loss

EXAM
CT ABDOMEN AND PELVIS WITHOUT CONTRAST

TECHNIQUE
Multidetector CT imaging of the abdomen and pelvis was performed
following the standard protocol without IV contrast.

COMPARISON
None.

FINDINGS
Lung bases clear. Normal heart size. No pericardial or pleural
effusion.

Abdomen: Kidneys demonstrate no acute obstruction, hydronephrosis,
or obstructing ureteral calculus on either side.

Liver, gallbladder, biliary system, pancreas, spleen, and adrenal
glands are within normal limits for age and noncontrast imaging.

No abdominal free fluid, fluid collection, hemorrhage, or abscess.

In the left upper quadrant, the splenic flexure of the colon
demonstrates an approximately 11 cm segment of abnormal bowel wall
thickening with surrounding strandy pericolonic edema. Small
adjacent mesenteric lymph nodes noted, measuring 5 mm or less in
size. Appearance is nonspecific by compatible with a short segment
of acute colitis versus a colonic malignancy. Proximal to this,
there is a large amount of retained stool within the cecum, right
colon, and transverse colon. Transverse colon is also moderately
distended with air. This appears to represent some degree of partial
colonic obstruction. Small bowel is not dilated.

Pelvis: No pelvic free fluid, fluid collection, hemorrhage, abscess,
adenopathy, inguinal abnormality, or hernia. Urinary bladder
collapsed. Minor distal colon diverticulosis. Distal colon is
collapsed. Prostate gland is mildly enlarged.

Degenerative changes of the spine. Previous T12 corpectomy with
fusion. No definite acute osseous finding.

IMPRESSION
Abnormal splenic flexure of the colon in the left upper quadrant
with wall thickening, surrounding pericolonic strandy edema and
prominent adjacent nonenlarged lymph nodes compatible with either
acute colitis versus a colonic malignancy.

Proximal colon is distended with retained formed stool and air,
compatible with some degree of partial colonic obstruction.

No free air or abscess

These results were called by telephone at the time of interpretation
on 07/18/2013 at [DATE] to Dr. HINKO ALPER , who verbally
acknowledged these results.

SIGNATURE

## 2016-02-20 DIAGNOSIS — N486 Induration penis plastica: Secondary | ICD-10-CM | POA: Diagnosis not present

## 2016-02-26 IMAGING — CR DG CHEST 1V PORT
1 series · 1 of 1 positions shown · non-contrast
Comparison: Chest x-ray 01/04/2012.

CLINICAL DATA: Fever.

EXAM:
PORTABLE CHEST - 1 VIEW

[AP]
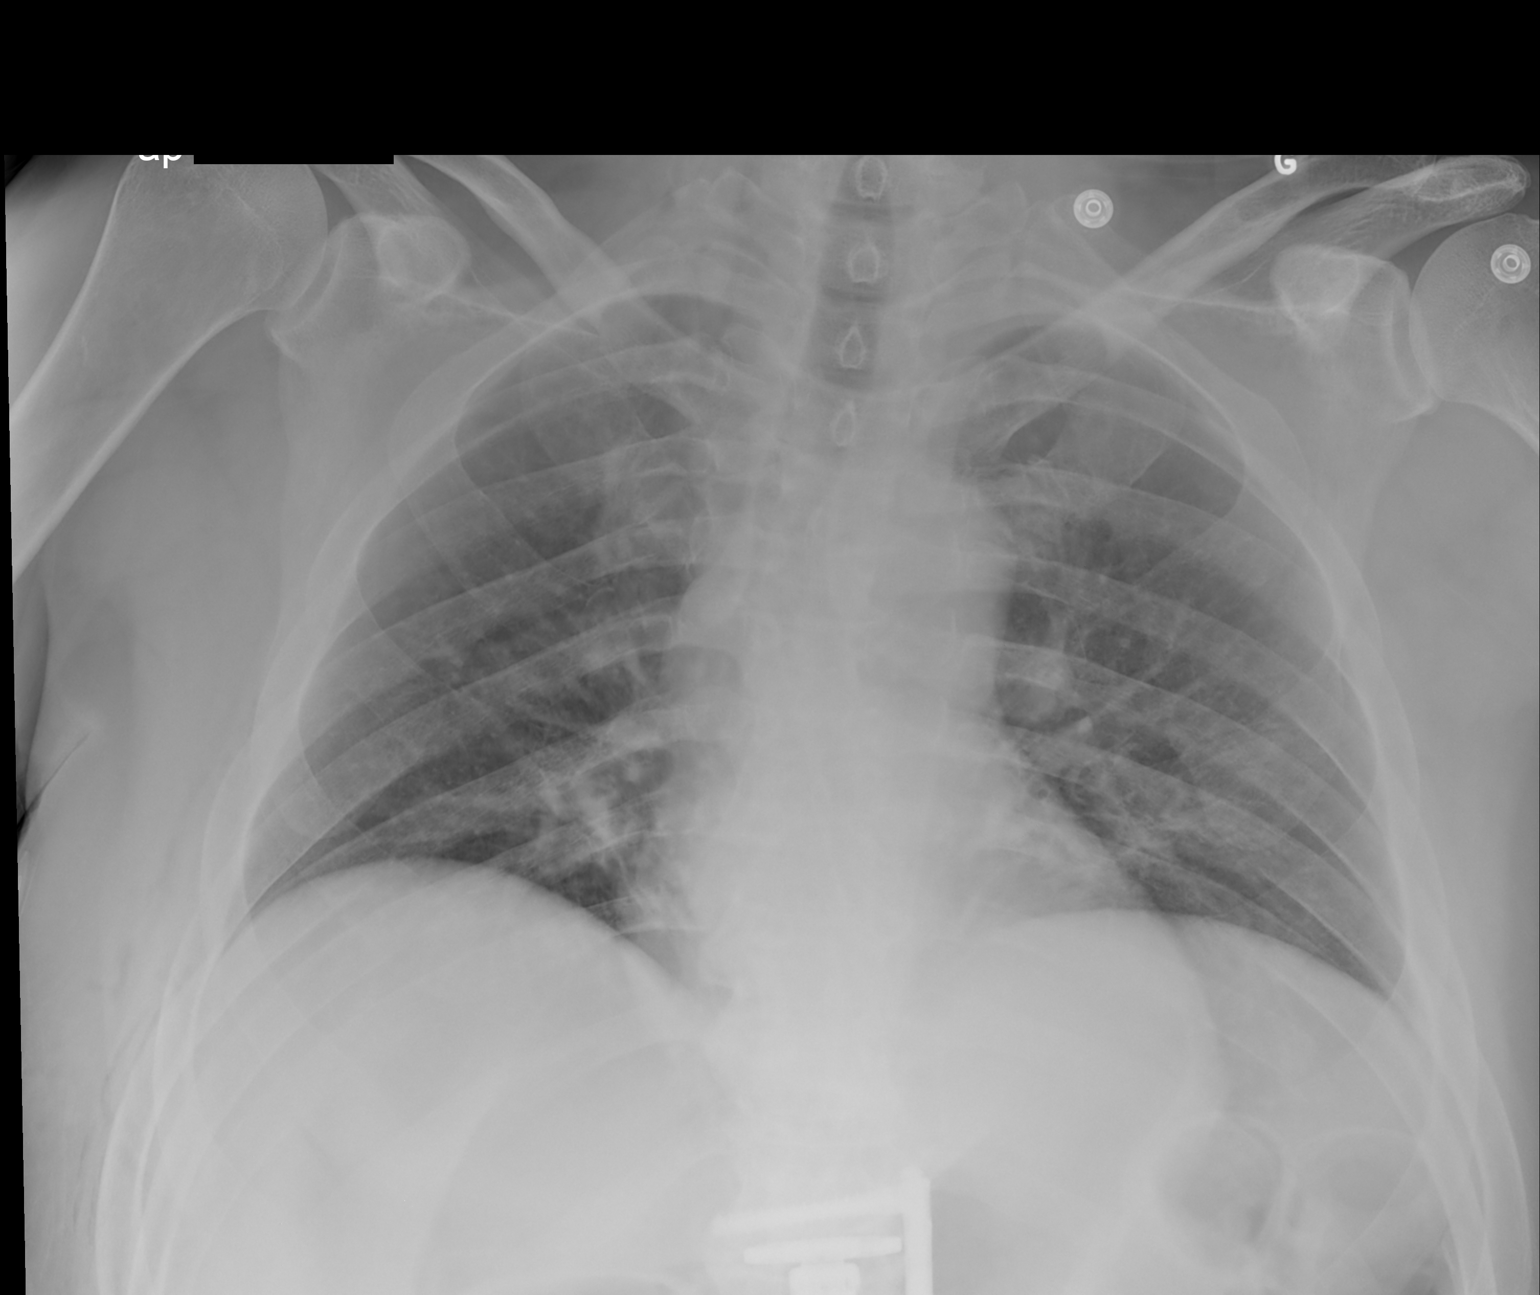

[1 of 1 positions shown; findings below may reference images not displayed]

FINDINGS: Lung volumes are low. No consolidative airspace disease. No pleural
effusions. No evidence of pulmonary edema. Heart size is normal. The
patient is rotated to the left on today's exam, resulting in
distortion of the mediastinal contours and reduced diagnostic
sensitivity and specificity for mediastinal pathology. Orthopedic
fixation hardware in the upper lumbar spine.
IMPRESSION: Low lung volumes without radiographic evidence of acute
cardiopulmonary disease.

## 2016-03-02 DIAGNOSIS — M47816 Spondylosis without myelopathy or radiculopathy, lumbar region: Secondary | ICD-10-CM | POA: Diagnosis not present

## 2016-03-02 DIAGNOSIS — S22000A Wedge compression fracture of unspecified thoracic vertebra, initial encounter for closed fracture: Secondary | ICD-10-CM | POA: Diagnosis not present

## 2016-03-02 DIAGNOSIS — G5603 Carpal tunnel syndrome, bilateral upper limbs: Secondary | ICD-10-CM | POA: Diagnosis not present

## 2016-03-02 DIAGNOSIS — M5136 Other intervertebral disc degeneration, lumbar region: Secondary | ICD-10-CM | POA: Diagnosis not present

## 2016-03-03 DIAGNOSIS — Z6833 Body mass index (BMI) 33.0-33.9, adult: Secondary | ICD-10-CM | POA: Diagnosis not present

## 2016-03-03 DIAGNOSIS — T451X5A Adverse effect of antineoplastic and immunosuppressive drugs, initial encounter: Secondary | ICD-10-CM | POA: Diagnosis not present

## 2016-03-03 DIAGNOSIS — R110 Nausea: Secondary | ICD-10-CM | POA: Diagnosis not present

## 2016-03-03 DIAGNOSIS — F419 Anxiety disorder, unspecified: Secondary | ICD-10-CM | POA: Diagnosis not present

## 2016-03-03 DIAGNOSIS — T451X5S Adverse effect of antineoplastic and immunosuppressive drugs, sequela: Secondary | ICD-10-CM | POA: Diagnosis not present

## 2016-03-03 DIAGNOSIS — Z85038 Personal history of other malignant neoplasm of large intestine: Secondary | ICD-10-CM | POA: Diagnosis not present

## 2016-03-03 DIAGNOSIS — Z08 Encounter for follow-up examination after completed treatment for malignant neoplasm: Secondary | ICD-10-CM | POA: Diagnosis not present

## 2016-03-03 DIAGNOSIS — G620 Drug-induced polyneuropathy: Secondary | ICD-10-CM | POA: Diagnosis not present

## 2016-03-03 DIAGNOSIS — C189 Malignant neoplasm of colon, unspecified: Secondary | ICD-10-CM | POA: Diagnosis not present

## 2016-03-03 DIAGNOSIS — E559 Vitamin D deficiency, unspecified: Secondary | ICD-10-CM | POA: Diagnosis not present

## 2016-03-03 DIAGNOSIS — E669 Obesity, unspecified: Secondary | ICD-10-CM | POA: Diagnosis not present

## 2016-03-03 DIAGNOSIS — M545 Low back pain: Secondary | ICD-10-CM | POA: Diagnosis not present

## 2016-03-05 DIAGNOSIS — M545 Low back pain: Secondary | ICD-10-CM | POA: Diagnosis not present

## 2016-03-05 DIAGNOSIS — G620 Drug-induced polyneuropathy: Secondary | ICD-10-CM | POA: Diagnosis not present

## 2016-03-05 DIAGNOSIS — E559 Vitamin D deficiency, unspecified: Secondary | ICD-10-CM | POA: Diagnosis not present

## 2016-03-05 DIAGNOSIS — Z6833 Body mass index (BMI) 33.0-33.9, adult: Secondary | ICD-10-CM | POA: Diagnosis not present

## 2016-03-05 DIAGNOSIS — T451X5A Adverse effect of antineoplastic and immunosuppressive drugs, initial encounter: Secondary | ICD-10-CM | POA: Diagnosis not present

## 2016-03-05 DIAGNOSIS — C189 Malignant neoplasm of colon, unspecified: Secondary | ICD-10-CM | POA: Diagnosis not present

## 2016-03-05 DIAGNOSIS — E669 Obesity, unspecified: Secondary | ICD-10-CM | POA: Diagnosis not present

## 2016-03-05 DIAGNOSIS — F419 Anxiety disorder, unspecified: Secondary | ICD-10-CM | POA: Diagnosis not present

## 2016-03-30 DIAGNOSIS — N486 Induration penis plastica: Secondary | ICD-10-CM | POA: Diagnosis not present

## 2016-03-31 DIAGNOSIS — N486 Induration penis plastica: Secondary | ICD-10-CM | POA: Diagnosis not present

## 2016-05-07 DIAGNOSIS — R404 Transient alteration of awareness: Secondary | ICD-10-CM | POA: Diagnosis not present

## 2016-05-11 DIAGNOSIS — 419620001 Death: Secondary | SNOMED CT | POA: Diagnosis not present

## 2016-05-11 DEATH — deceased

## 2018-12-13 ENCOUNTER — Encounter: Payer: Self-pay | Admitting: Genetic Counselor
